# Patient Record
Sex: Female | Born: 1940 | Race: White | Hispanic: No | Marital: Married | Smoking: Never smoker
Health system: Southern US, Community
[De-identification: ages and names within clinical notes are randomized; demographics above are authoritative.]

## PROBLEM LIST (undated history)

## (undated) DIAGNOSIS — M1990 Unspecified osteoarthritis, unspecified site: Secondary | ICD-10-CM

## (undated) DIAGNOSIS — R300 Dysuria: Secondary | ICD-10-CM

## (undated) DIAGNOSIS — K5900 Constipation, unspecified: Secondary | ICD-10-CM

## (undated) DIAGNOSIS — Z87440 Personal history of urinary (tract) infections: Secondary | ICD-10-CM

## (undated) DIAGNOSIS — N952 Postmenopausal atrophic vaginitis: Secondary | ICD-10-CM

## (undated) DIAGNOSIS — K219 Gastro-esophageal reflux disease without esophagitis: Secondary | ICD-10-CM

## (undated) DIAGNOSIS — R109 Unspecified abdominal pain: Secondary | ICD-10-CM

## (undated) DIAGNOSIS — E785 Hyperlipidemia, unspecified: Secondary | ICD-10-CM

## (undated) HISTORY — DX: Postmenopausal atrophic vaginitis: N95.2

## (undated) HISTORY — DX: Gastro-esophageal reflux disease without esophagitis: K21.9

## (undated) HISTORY — PX: BREAST BIOPSY: SHX20

## (undated) HISTORY — PX: ABDOMINAL HYSTERECTOMY: SHX81

## (undated) HISTORY — DX: Unspecified abdominal pain: R10.9

## (undated) HISTORY — PX: BREAST EXCISIONAL BIOPSY: SUR124

## (undated) HISTORY — PX: MOHS SURGERY: SHX181

## (undated) HISTORY — DX: Hyperlipidemia, unspecified: E78.5

## (undated) HISTORY — PX: COLONOSCOPY: SHX174

## (undated) HISTORY — PX: BREAST SURGERY: SHX581

## (undated) HISTORY — DX: Dysuria: R30.0

---

## 2005-01-31 ENCOUNTER — Ambulatory Visit: Payer: Self-pay | Admitting: Family Medicine

## 2006-03-13 ENCOUNTER — Ambulatory Visit: Payer: Self-pay | Admitting: Family Medicine

## 2006-04-19 ENCOUNTER — Ambulatory Visit: Payer: Self-pay | Admitting: Gastroenterology

## 2007-05-14 ENCOUNTER — Ambulatory Visit: Payer: Self-pay | Admitting: Family Medicine

## 2008-05-20 ENCOUNTER — Ambulatory Visit: Payer: Self-pay | Admitting: Family Medicine

## 2009-06-10 ENCOUNTER — Ambulatory Visit: Payer: Self-pay | Admitting: Family Medicine

## 2010-07-27 ENCOUNTER — Ambulatory Visit: Payer: Self-pay | Admitting: Family Medicine

## 2011-08-30 ENCOUNTER — Ambulatory Visit: Payer: Self-pay | Admitting: Family Medicine

## 2012-10-08 ENCOUNTER — Ambulatory Visit: Payer: Self-pay | Admitting: Internal Medicine

## 2013-03-04 ENCOUNTER — Ambulatory Visit: Payer: Self-pay

## 2013-10-09 ENCOUNTER — Ambulatory Visit: Payer: Self-pay | Admitting: Internal Medicine

## 2014-02-20 DIAGNOSIS — M810 Age-related osteoporosis without current pathological fracture: Secondary | ICD-10-CM | POA: Insufficient documentation

## 2014-02-20 DIAGNOSIS — K219 Gastro-esophageal reflux disease without esophagitis: Secondary | ICD-10-CM | POA: Insufficient documentation

## 2014-02-20 DIAGNOSIS — E785 Hyperlipidemia, unspecified: Secondary | ICD-10-CM | POA: Insufficient documentation

## 2014-11-10 ENCOUNTER — Ambulatory Visit: Payer: Self-pay | Admitting: Internal Medicine

## 2014-11-10 DIAGNOSIS — Z1231 Encounter for screening mammogram for malignant neoplasm of breast: Secondary | ICD-10-CM | POA: Diagnosis not present

## 2014-11-22 DIAGNOSIS — H2513 Age-related nuclear cataract, bilateral: Secondary | ICD-10-CM | POA: Diagnosis not present

## 2014-12-04 DIAGNOSIS — R300 Dysuria: Secondary | ICD-10-CM | POA: Diagnosis not present

## 2014-12-04 DIAGNOSIS — N390 Urinary tract infection, site not specified: Secondary | ICD-10-CM | POA: Diagnosis not present

## 2015-01-14 DIAGNOSIS — N3090 Cystitis, unspecified without hematuria: Secondary | ICD-10-CM | POA: Diagnosis not present

## 2015-01-14 DIAGNOSIS — R350 Frequency of micturition: Secondary | ICD-10-CM | POA: Diagnosis not present

## 2015-01-26 DIAGNOSIS — H1010 Acute atopic conjunctivitis, unspecified eye: Secondary | ICD-10-CM | POA: Diagnosis not present

## 2015-01-26 DIAGNOSIS — J329 Chronic sinusitis, unspecified: Secondary | ICD-10-CM | POA: Diagnosis not present

## 2015-01-26 DIAGNOSIS — B9689 Other specified bacterial agents as the cause of diseases classified elsewhere: Secondary | ICD-10-CM | POA: Diagnosis not present

## 2015-03-01 DIAGNOSIS — E785 Hyperlipidemia, unspecified: Secondary | ICD-10-CM | POA: Diagnosis not present

## 2015-03-01 DIAGNOSIS — Z79899 Other long term (current) drug therapy: Secondary | ICD-10-CM | POA: Diagnosis not present

## 2015-03-07 DIAGNOSIS — M160 Bilateral primary osteoarthritis of hip: Secondary | ICD-10-CM | POA: Diagnosis not present

## 2015-03-07 DIAGNOSIS — Z0000 Encounter for general adult medical examination without abnormal findings: Secondary | ICD-10-CM | POA: Diagnosis not present

## 2015-03-07 DIAGNOSIS — M810 Age-related osteoporosis without current pathological fracture: Secondary | ICD-10-CM | POA: Diagnosis not present

## 2015-03-07 DIAGNOSIS — R198 Other specified symptoms and signs involving the digestive system and abdomen: Secondary | ICD-10-CM | POA: Diagnosis not present

## 2015-03-07 DIAGNOSIS — K219 Gastro-esophageal reflux disease without esophagitis: Secondary | ICD-10-CM | POA: Diagnosis not present

## 2015-03-07 DIAGNOSIS — N390 Urinary tract infection, site not specified: Secondary | ICD-10-CM | POA: Diagnosis not present

## 2015-03-07 DIAGNOSIS — Z23 Encounter for immunization: Secondary | ICD-10-CM | POA: Diagnosis not present

## 2015-03-07 DIAGNOSIS — E780 Pure hypercholesterolemia: Secondary | ICD-10-CM | POA: Diagnosis not present

## 2015-03-11 ENCOUNTER — Other Ambulatory Visit: Payer: Self-pay | Admitting: Nurse Practitioner

## 2015-03-11 DIAGNOSIS — L989 Disorder of the skin and subcutaneous tissue, unspecified: Secondary | ICD-10-CM | POA: Diagnosis not present

## 2015-03-11 DIAGNOSIS — R194 Change in bowel habit: Secondary | ICD-10-CM | POA: Diagnosis not present

## 2015-03-15 ENCOUNTER — Ambulatory Visit
Admission: RE | Admit: 2015-03-15 | Discharge: 2015-03-15 | Disposition: A | Discharge status: Home / Self Care | Payer: Commercial Managed Care - HMO | Source: Ambulatory Visit | Attending: Nurse Practitioner | Admitting: Nurse Practitioner

## 2015-03-15 DIAGNOSIS — L989 Disorder of the skin and subcutaneous tissue, unspecified: Secondary | ICD-10-CM | POA: Diagnosis not present

## 2015-03-15 DIAGNOSIS — R29898 Other symptoms and signs involving the musculoskeletal system: Secondary | ICD-10-CM | POA: Diagnosis not present

## 2015-03-23 ENCOUNTER — Encounter: Payer: Self-pay | Admitting: *Deleted

## 2015-03-24 ENCOUNTER — Ambulatory Visit
Admission: RE | Admit: 2015-03-24 | Discharge: 2015-03-24 | Disposition: A | Discharge status: Home / Self Care | Payer: Commercial Managed Care - HMO | Source: Ambulatory Visit | Attending: Gastroenterology | Admitting: Gastroenterology

## 2015-03-24 ENCOUNTER — Encounter
Admission: RE | Disposition: A | Discharge status: Home / Self Care | Payer: Self-pay | Source: Ambulatory Visit | Attending: Gastroenterology

## 2015-03-24 ENCOUNTER — Ambulatory Visit: Payer: Commercial Managed Care - HMO | Admitting: Anesthesiology

## 2015-03-24 DIAGNOSIS — Z79899 Other long term (current) drug therapy: Secondary | ICD-10-CM | POA: Diagnosis not present

## 2015-03-24 DIAGNOSIS — K635 Polyp of colon: Secondary | ICD-10-CM | POA: Diagnosis not present

## 2015-03-24 DIAGNOSIS — R197 Diarrhea, unspecified: Secondary | ICD-10-CM | POA: Diagnosis not present

## 2015-03-24 DIAGNOSIS — L989 Disorder of the skin and subcutaneous tissue, unspecified: Secondary | ICD-10-CM | POA: Diagnosis not present

## 2015-03-24 DIAGNOSIS — D123 Benign neoplasm of transverse colon: Secondary | ICD-10-CM | POA: Diagnosis not present

## 2015-03-24 DIAGNOSIS — Z7982 Long term (current) use of aspirin: Secondary | ICD-10-CM | POA: Diagnosis not present

## 2015-03-24 DIAGNOSIS — D122 Benign neoplasm of ascending colon: Secondary | ICD-10-CM | POA: Diagnosis not present

## 2015-03-24 DIAGNOSIS — R194 Change in bowel habit: Secondary | ICD-10-CM | POA: Diagnosis present

## 2015-03-24 HISTORY — PX: COLONOSCOPY WITH PROPOFOL: SHX5780

## 2015-03-24 HISTORY — DX: Personal history of urinary (tract) infections: Z87.440

## 2015-03-24 HISTORY — DX: Unspecified osteoarthritis, unspecified site: M19.90

## 2015-03-24 HISTORY — DX: Constipation, unspecified: K59.00

## 2015-03-24 SURGERY — COLONOSCOPY WITH PROPOFOL
Anesthesia: General

## 2015-03-24 MED ORDER — SODIUM CHLORIDE 0.9 % IV SOLN
INTRAVENOUS | Status: DC
  Administered 2015-03-24: 1000 mL via INTRAVENOUS

## 2015-03-24 MED ORDER — PROPOFOL 10 MG/ML IV BOLUS
INTRAVENOUS | Status: DC | PRN
  Administered 2015-03-24 (×3): 20 mg via INTRAVENOUS

## 2015-03-24 MED ORDER — MIDAZOLAM HCL 2 MG/2ML IJ SOLN
INTRAMUSCULAR | Status: DC | PRN
  Administered 2015-03-24: 1 mg via INTRAVENOUS

## 2015-03-24 NOTE — Anesthesia Preprocedure Evaluation (Signed)
Anesthesia Evaluation  Patient identified by MRN, date of birth, ID band Patient awake    Reviewed: Allergy & Precautions, H&P , NPO status , Patient's Chart, lab work & pertinent test results, reviewed documented beta blocker date and time   Airway Mallampati: II  TM Distance: >3 FB Neck ROM: full    Dental no notable dental hx. (+) Teeth Intact   Pulmonary neg pulmonary ROS,  breath sounds clear to auscultation  Pulmonary exam normal       Cardiovascular Exercise Tolerance: Good negative cardio ROS  Rhythm:regular Rate:Normal     Neuro/Psych negative neurological ROS  negative psych ROS   GI/Hepatic negative GI ROS, Neg liver ROS,   Endo/Other  negative endocrine ROS  Renal/GU negative Renal ROS  negative genitourinary   Musculoskeletal   Abdominal   Peds  Hematology negative hematology ROS (+)   Anesthesia Other Findings   Reproductive/Obstetrics negative OB ROS                             Anesthesia Physical Anesthesia Plan  ASA: II  Anesthesia Plan: General   Post-op Pain Management:    Induction:   Airway Management Planned:   Additional Equipment:   Intra-op Plan:   Post-operative Plan:   Informed Consent: I have reviewed the patients History and Physical, chart, labs and discussed the procedure including the risks, benefits and alternatives for the proposed anesthesia with the patient or authorized representative who has indicated his/her understanding and acceptance.   Dental Advisory Given  Plan Discussed with: CRNA  Anesthesia Plan Comments:         Anesthesia Quick Evaluation

## 2015-03-24 NOTE — Op Note (Signed)
[facility] Patient Name: Kelly Rivas Procedure Date: 03/24/2015 9:15 AM MRN: [id number] Account #: [id number] Date of Birth: 12-Nov-1940 Admit Type: Outpatient Age: 74 Room: [facility] ENDO ROOM 4 Gender: Female Note Status: Finalized Procedure:         Colonoscopy Indications:       Change in bowel habits Providers:         Lupita Dawn. Candace Cruise, MD Referring MD:      Ocie Cornfield. Ouida Sills, MD (Referring MD) Medicines:         Monitored Anesthesia Care Complications:     No immediate complications. Procedure:         Pre-Anesthesia Assessment:                    - Prior to the procedure, a History and Physical was                     performed, and patient medications, allergies and                     sensitivities were reviewed. The patient's tolerance of                     previous anesthesia was reviewed.                    - The risks and benefits of the procedure and the sedation                     options and risks were discussed with the patient. All                     questions were answered and informed consent was obtained.                    - After reviewing the risks and benefits, the patient was                     deemed in satisfactory condition to undergo the procedure.                    After obtaining informed consent, the colonoscope was                     passed under direct vision. Throughout the procedure, the                     patient's blood pressure, pulse, and oxygen saturations                     were monitored continuously. The Colonoscope was                     introduced through the anus and advanced to the the cecum,                     identified by appendiceal orifice and ileocecal valve. The                     colonoscopy was performed without difficulty. The patient                     tolerated the procedure well. The quality of the bowel  preparation was good. Findings:      A small polyp was  found at the hepatic flexure. The polyp was sessile.       The polyp was removed with a cold snare. Resection and retrieval were       complete. Biopsies for histology were taken with a cold forceps from the       left colon for evaluation of microscopic colitis.      The exam was otherwise without abnormality. Impression:        - One small polyp at the hepatic flexure. Resected and                     retrieved. Biopsied.                    - The examination was otherwise normal. Recommendation:    - Discharge patient to home.                    - Await pathology results.                    - Repeat colonoscopy in 5 years for surveillance based on                     pathology results.                    - The findings and recommendations were discussed with the                     patient. 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 All rights reserved. The codes documented in this report are preliminary and upon coder review may  be revised to meet current compliance requirements. Hulen Luster, MD 03/24/2015 9:46:32 AM This report has been signed electronically. Number of Addenda: 0 Note Initiated On: 03/24/2015 9:15 AM Scope Withdrawal Time: 0 hours 5 minutes 19 seconds  Total Procedure Duration: 0 hours 14 minutes 29 seconds       [facility]

## 2015-03-24 NOTE — H&P (Signed)
  Date of Initial H&P:03/11/2015  History reviewed, patient examined, no change in status, stable for surgery.

## 2015-03-24 NOTE — Transfer of Care (Signed)
Immediate Anesthesia Transfer of Care Note  Patient: Kelly Rivas  Procedure(s) Performed: Procedure(s): COLONOSCOPY WITH PROPOFOL (N/A)  Patient Location: PACU and Endoscopy Unit  Anesthesia Type:General  Level of Consciousness: sedated  Airway & Oxygen Therapy: Patient connected to nasal cannula oxygen  Post-op Assessment: Report given to RN  Post vital signs: stable  Last Vitals:  Filed Vitals:   03/24/15 0949  BP: 128/76  Pulse: 73  Temp: 35.8 C  Resp: 16    Complications: No apparent anesthesia complications

## 2015-03-24 NOTE — Anesthesia Postprocedure Evaluation (Signed)
  Anesthesia Post-op Note  Patient: Kelly Rivas  Procedure(s) Performed: Procedure(s): COLONOSCOPY WITH PROPOFOL (N/A)  Anesthesia type:General  Patient location: PACU  Post pain: Pain level controlled  Post assessment: Post-op Vital signs reviewed, Patient's Cardiovascular Status Stable, Respiratory Function Stable, Patent Airway and No signs of Nausea or vomiting  Post vital signs: Reviewed and stable  Last Vitals:  Filed Vitals:   03/24/15 0949  BP: 128/76  Pulse: 73  Temp: 35.8 C  Resp: 16    Level of consciousness: awake, alert  and patient cooperative  Complications: No apparent anesthesia complications

## 2015-03-25 ENCOUNTER — Encounter: Payer: Self-pay | Admitting: Gastroenterology

## 2015-03-25 LAB — SURGICAL PATHOLOGY

## 2015-04-06 ENCOUNTER — Other Ambulatory Visit: Payer: Self-pay | Admitting: Nurse Practitioner

## 2015-04-06 DIAGNOSIS — L989 Disorder of the skin and subcutaneous tissue, unspecified: Secondary | ICD-10-CM

## 2015-04-12 ENCOUNTER — Ambulatory Visit
Admission: RE | Admit: 2015-04-12 | Discharge: 2015-04-12 | Disposition: A | Discharge status: Home / Self Care | Payer: Commercial Managed Care - HMO | Source: Ambulatory Visit | Attending: Nurse Practitioner | Admitting: Nurse Practitioner

## 2015-04-12 DIAGNOSIS — M629 Disorder of muscle, unspecified: Secondary | ICD-10-CM | POA: Diagnosis not present

## 2015-04-12 DIAGNOSIS — L989 Disorder of the skin and subcutaneous tissue, unspecified: Secondary | ICD-10-CM | POA: Diagnosis not present

## 2015-04-12 MED ORDER — GADOBENATE DIMEGLUMINE 529 MG/ML IV SOLN
15.0000 mL | Freq: Once | INTRAVENOUS | Status: AC | PRN
  Administered 2015-04-12: 12 mL via INTRAVENOUS

## 2015-04-22 ENCOUNTER — Other Ambulatory Visit: Payer: Self-pay | Admitting: *Deleted

## 2015-04-22 ENCOUNTER — Encounter: Payer: Self-pay | Admitting: *Deleted

## 2015-05-03 ENCOUNTER — Encounter: Payer: Self-pay | Admitting: Urology

## 2015-05-03 ENCOUNTER — Ambulatory Visit (INDEPENDENT_AMBULATORY_CARE_PROVIDER_SITE_OTHER): Payer: Commercial Managed Care - HMO | Admitting: Urology

## 2015-05-03 VITALS — BP 107/74 | HR 76 | Ht 62.0 in | Wt 132.7 lb

## 2015-05-03 DIAGNOSIS — N390 Urinary tract infection, site not specified: Secondary | ICD-10-CM | POA: Diagnosis not present

## 2015-05-03 DIAGNOSIS — N952 Postmenopausal atrophic vaginitis: Secondary | ICD-10-CM | POA: Insufficient documentation

## 2015-05-03 NOTE — Progress Notes (Signed)
05/03/2015 9:10 AM   Kelly Rivas [birth year] [id number]  Referring provider: Kirk Ruths, MD [place], [place] [address]  Chief Complaint  Patient presents with  . Chronic uti    one year recheck  . Vaginitis    one year recheck    HPI: Patient is a 74 year old white female with a history of atrophic vaginitis and recurrent urinary tract infections who presents today for a 1 year recheck.  She is still using the compounded vaginal estrogen cream from [facility] 3 nights weekly.  He is not experiencing any irritation or rash from applying the cream.  She is not sexually active at this time.  She does report a urinary tract infection in 12/04/2014 for 2 species Escherichia coli and enterococcus.  Both organisms were pan sensitive A responded to the antibiotic she was given.  Her baseline urinary symptoms are nocturia 2, occasional leakage of urine, occasional urinary hesitancy and occasional weak urinary stream. These are not bothersome to her she denies any fever, chills, nausea or vomiting. She denies any recent gross hematuria, dysuria or suprapubic pain.     PMH: Past Medical History  Diagnosis Date  . Arthritis   . History of recurrent UTIs   . Constipation   . Dysuria   . HLD (hyperlipidemia)   . GERD (gastroesophageal reflux disease)   . Atrophic vaginitis   . Flank pain     Surgical History: Past Surgical History  Procedure Laterality Date  . Breast surgery    . Abdominal hysterectomy    . Colonoscopy N/A   . Colonoscopy with propofol N/A 03/24/2015    Procedure: COLONOSCOPY WITH PROPOFOL;  Surgeon: Hulen Luster, MD;  Location: [facility] ENDOSCOPY;  Service: Gastroenterology;  Laterality: N/A;  . Mohs surgery      Home Medications:    Medication List       This list is accurate as of: 05/03/15  9:10 AM.  Always use your most recent med list.               aspirin EC 81 MG tablet  Take by mouth.     azelastine 0.05 %  ophthalmic solution  Commonly known as:  OPTIVAR  Apply to eye.     Calcium Carbonate-Vitamin D 600-400 MG-UNIT per tablet  Take by mouth.     Calcium Citrate-Vitamin D 1000-400 Liqd     ESTRACE VAGINAL 0.1 MG/GM vaginal cream  Generic drug:  estradiol  Place vaginally.     EYE DROPS 0.012-0.2 % Soln  Generic drug:  Naphazoline-Polyethyl Glycol     fluticasone 50 MCG/ACT nasal spray  Commonly known as:  FLONASE  Place into the nose.     MULTI-VITAMINS Tabs  Take by mouth.     omeprazole 20 MG capsule  Commonly known as:  PRILOSEC  Take by mouth.     pravastatin 40 MG tablet  Commonly known as:  PRAVACHOL  TAKE 1 TABLET EVERY DAY     vitamin C 500 MG tablet  Commonly known as:  ASCORBIC ACID  Take by mouth.        Allergies:  Allergies  Allergen Reactions  . Nitrofurantoin     Family History: Family History  Problem Relation Age of Onset  . Diabetes Mellitus II Mother   . Breast cancer Sister   . Kidney disease Neg Hx   . Bladder Cancer Neg Hx     Social History:  reports that she  has never smoked. She has never used smokeless tobacco. She reports that she does not drink alcohol or use illicit drugs.  ROS: UROLOGY Frequent Urination?: No Hard to postpone urination?: No Burning/pain with urination?: No Get up at night to urinate?: Yes Leakage of urine?: Yes Urine stream starts and stops?: Yes Trouble starting stream?: No Do you have to strain to urinate?: No Blood in urine?: No Urinary tract infection?: No Sexually transmitted disease?: No Injury to kidneys or bladder?: No Painful intercourse?: No Weak stream?: Yes Currently pregnant?: No Vaginal bleeding?: No Last menstrual period?: n  Gastrointestinal Nausea?: No Vomiting?: No Indigestion/heartburn?: Yes Diarrhea?: Yes Constipation?: No  Constitutional Fever: No Night sweats?: Yes Weight loss?: No Fatigue?: No  Skin Skin rash/lesions?: No Itching?: No  Eyes Blurred vision?:  No Double vision?: No  Ears/Nose/Throat Sore throat?: No Sinus problems?: Yes  Hematologic/Lymphatic Swollen glands?: No Easy bruising?: Yes  Cardiovascular Leg swelling?: Yes Chest pain?: No  Respiratory Cough?: No Shortness of breath?: No  Endocrine Excessive thirst?: No  Musculoskeletal Back pain?: Yes Joint pain?: Yes  Neurological Headaches?: No Dizziness?: No  Psychologic Depression?: No Anxiety?: No   Physical Exam: BP 107/74 mmHg  Pulse 76  Ht 5\' 2"  (1.575 m)  Wt 132 lb 11.2 oz (60.192 kg)  BMI 24.26 kg/m2  GU: Atrophic external genitalia.  Normal urethral meatus. No urethral masses and/or tenderness. No bladder fullness or masses. No vaginal lesions or discharge. Normal rectal tone, no masses. Normal anus and perineum.     Assessment & Plan:    1. Atrophic vaginitis:  Patient is applying the vaginal estrogen cream (COMPOUNDED) three nights weekly.  She is not having irritation or rash from the cream.  She will continue the medication.  She does not need a refill at this time.  2. Recurrent UTI's:  Patient had 1 urinary tract infection since she was last seen by [place] 1 year ago. She would like to have [place] treat her UTI's.  Patient is advised that she can call our office if she feels symptoms of an UTI and have [place] look at an UA.    There are no diagnoses linked to this encounter.  No Follow-up on file.  Zara Council, [facility] [address] [place], [place] [address] [phone]

## 2015-05-18 DIAGNOSIS — X32XXXA Exposure to sunlight, initial encounter: Secondary | ICD-10-CM | POA: Diagnosis not present

## 2015-05-18 DIAGNOSIS — D2271 Melanocytic nevi of right lower limb, including hip: Secondary | ICD-10-CM | POA: Diagnosis not present

## 2015-05-18 DIAGNOSIS — Z85828 Personal history of other malignant neoplasm of skin: Secondary | ICD-10-CM | POA: Diagnosis not present

## 2015-05-18 DIAGNOSIS — D225 Melanocytic nevi of trunk: Secondary | ICD-10-CM | POA: Diagnosis not present

## 2015-05-18 DIAGNOSIS — L821 Other seborrheic keratosis: Secondary | ICD-10-CM | POA: Diagnosis not present

## 2015-05-18 DIAGNOSIS — L570 Actinic keratosis: Secondary | ICD-10-CM | POA: Diagnosis not present

## 2015-08-02 DIAGNOSIS — J069 Acute upper respiratory infection, unspecified: Secondary | ICD-10-CM | POA: Diagnosis not present

## 2015-11-24 DIAGNOSIS — H2513 Age-related nuclear cataract, bilateral: Secondary | ICD-10-CM | POA: Diagnosis not present

## 2016-02-29 DIAGNOSIS — E786 Lipoprotein deficiency: Secondary | ICD-10-CM | POA: Diagnosis not present

## 2016-03-07 ENCOUNTER — Other Ambulatory Visit: Payer: Self-pay | Admitting: Internal Medicine

## 2016-03-07 DIAGNOSIS — Z0000 Encounter for general adult medical examination without abnormal findings: Secondary | ICD-10-CM | POA: Diagnosis not present

## 2016-03-07 DIAGNOSIS — Z1231 Encounter for screening mammogram for malignant neoplasm of breast: Secondary | ICD-10-CM

## 2016-03-07 DIAGNOSIS — K219 Gastro-esophageal reflux disease without esophagitis: Secondary | ICD-10-CM | POA: Diagnosis not present

## 2016-03-07 DIAGNOSIS — M810 Age-related osteoporosis without current pathological fracture: Secondary | ICD-10-CM | POA: Diagnosis not present

## 2016-03-07 DIAGNOSIS — E7800 Pure hypercholesterolemia, unspecified: Secondary | ICD-10-CM | POA: Diagnosis not present

## 2016-03-23 ENCOUNTER — Ambulatory Visit
Admission: RE | Admit: 2016-03-23 | Discharge: 2016-03-23 | Disposition: A | Discharge status: Home / Self Care | Payer: Commercial Managed Care - HMO | Source: Ambulatory Visit | Attending: Internal Medicine | Admitting: Internal Medicine

## 2016-03-23 ENCOUNTER — Other Ambulatory Visit: Payer: Self-pay | Admitting: Internal Medicine

## 2016-03-23 DIAGNOSIS — Z1231 Encounter for screening mammogram for malignant neoplasm of breast: Secondary | ICD-10-CM | POA: Insufficient documentation

## 2016-04-16 DIAGNOSIS — R350 Frequency of micturition: Secondary | ICD-10-CM | POA: Diagnosis not present

## 2016-04-16 DIAGNOSIS — R102 Pelvic and perineal pain: Secondary | ICD-10-CM | POA: Diagnosis not present

## 2016-04-16 DIAGNOSIS — N390 Urinary tract infection, site not specified: Secondary | ICD-10-CM | POA: Diagnosis not present

## 2016-05-02 ENCOUNTER — Ambulatory Visit (INDEPENDENT_AMBULATORY_CARE_PROVIDER_SITE_OTHER): Payer: Commercial Managed Care - HMO | Admitting: Urology

## 2016-05-02 ENCOUNTER — Encounter: Payer: Self-pay | Admitting: Urology

## 2016-05-02 ENCOUNTER — Telehealth: Payer: Self-pay | Admitting: Urology

## 2016-05-02 VITALS — BP 114/78 | HR 79 | Ht 65.0 in | Wt 136.0 lb

## 2016-05-02 DIAGNOSIS — N952 Postmenopausal atrophic vaginitis: Secondary | ICD-10-CM | POA: Diagnosis not present

## 2016-05-02 DIAGNOSIS — N200 Calculus of kidney: Secondary | ICD-10-CM

## 2016-05-02 DIAGNOSIS — Z87440 Personal history of urinary (tract) infections: Secondary | ICD-10-CM

## 2016-05-02 NOTE — Telephone Encounter (Signed)
Please call [facility] and have them refill the compounded estrogen cream for [name].

## 2016-05-02 NOTE — Progress Notes (Signed)
05/02/2016 8:53 AM   Kelly Rivas [birth year] [id number]  Referring provider: Kirk Ruths, MD [place] [facility] [place], [place] [address]  Chief Complaint  Patient presents with  . Vaginitis    1 year follow up    HPI: Patient is a 75 year old Caucasian female with a history of atrophic vaginitis and recurrent urinary tract infections who presents today for a 1 year recheck.  Since she was last seen, she passed three urinary stones in March 2017.  She did not have gross hematuria.  She only had stomach cramps and loose BM's.  Once she passed the stones, the symptoms abated.  She was seen by her PCP in August 2017 for urinary frequency, loose BM's and low pelvic and back pain.  She increased her cranberry juice and started AZO and her symptoms improved.  Her UA on the 04/16/2016 was contaminated and her urine culture grew out mixed urogenital flora.  She was given an antibiotic, Cipro 250 mg for seven days and her symptoms completely abated.   She is still using the compounded vaginal estrogen cream from [facility] 3 nights weekly.  She is not experiencing any irritation or rash from applying the cream.  She is not sexually active at this time.  She does report a urinary tract infection in 12/04/2014 for 2 species Escherichia coli and enterococcus.  Both organisms were pan sensitive.  It responded to the antibiotic she was given.  Her baseline urinary symptoms are nocturia 2 intermittently, occasional urinary hesitancy and occasional weak urinary stream. These are not bothersome to her she denies any fever, chills, nausea or vomiting. She denies any recent gross hematuria, dysuria or suprapubic pain.  She is not having constipation and her urinary leakage has abated since her and her husband have started a walking problem.    PMH: Past Medical History:  Diagnosis Date  . Arthritis   . Atrophic vaginitis   . Constipation   . Dysuria   .  Flank pain   . GERD (gastroesophageal reflux disease)   . History of recurrent UTIs   . HLD (hyperlipidemia)     Surgical History: Past Surgical History:  Procedure Laterality Date  . ABDOMINAL HYSTERECTOMY    . BREAST BIOPSY Bilateral 1970's  . BREAST SURGERY    . COLONOSCOPY N/A   . COLONOSCOPY WITH PROPOFOL N/A 03/24/2015   Procedure: COLONOSCOPY WITH PROPOFOL;  Surgeon: Hulen Luster, MD;  Location: [facility] ENDOSCOPY;  Service: Gastroenterology;  Laterality: N/A;  . MOHS SURGERY      Home Medications:    Medication List       Accurate as of 05/02/16  8:53 AM. Always use your most recent med list.          aspirin EC 81 MG tablet Take by mouth.   Calcium Carbonate-Vitamin D 600-400 MG-UNIT tablet Take by mouth.   Calcium Citrate-Vitamin D 1000-400 Liqd   ESTRACE VAGINAL 0.1 MG/GM vaginal cream Generic drug:  estradiol Place vaginally.   EYE DROPS 0.012-0.2 % Soln Generic drug:  Naphazoline-Polyethyl Glycol   fluticasone 50 MCG/ACT nasal spray Commonly known as:  FLONASE Place into the nose.   MULTI-VITAMINS Tabs Take by mouth.   omeprazole 20 MG capsule Commonly known as:  PRILOSEC Take by mouth.   pravastatin 40 MG tablet Commonly known as:  PRAVACHOL TAKE 1 TABLET EVERY DAY   psyllium 0.52 g capsule Commonly known as:  REGULOID Take 0.52 g by  mouth 2 (two) times daily.   vitamin C 500 MG tablet Commonly known as:  ASCORBIC ACID Take by mouth.       Allergies:  Allergies  Allergen Reactions  . Nitrofurantoin     Family History: Family History  Problem Relation Age of Onset  . Diabetes Mellitus II Mother   . Breast cancer Sister   . Kidney disease Neg Hx   . Bladder Cancer Neg Hx     Social History:  reports that she has never smoked. She has never used smokeless tobacco. She reports that she does not drink alcohol or use drugs.  ROS: UROLOGY Frequent Urination?: No Hard to postpone urination?: No Burning/pain with urination?: No Get  up at night to urinate?: No Leakage of urine?: No Urine stream starts and stops?: No Trouble starting stream?: No Do you have to strain to urinate?: No Blood in urine?: No Urinary tract infection?: No Sexually transmitted disease?: No Injury to kidneys or bladder?: No Painful intercourse?: No Weak stream?: No Currently pregnant?: No Vaginal bleeding?: No Last menstrual period?: n  Gastrointestinal Nausea?: No Vomiting?: No Indigestion/heartburn?: No Diarrhea?: No Constipation?: No  Constitutional Fever: No Night sweats?: No Weight loss?: No Fatigue?: No  Skin Skin rash/lesions?: No Itching?: No  Eyes Blurred vision?: No Double vision?: No  Ears/Nose/Throat Sore throat?: No Sinus problems?: No  Hematologic/Lymphatic Swollen glands?: No Easy bruising?: No  Cardiovascular Leg swelling?: No Chest pain?: No  Respiratory Cough?: No Shortness of breath?: No  Endocrine Excessive thirst?: No  Musculoskeletal Back pain?: No Joint pain?: No  Neurological Headaches?: No Dizziness?: No  Psychologic Depression?: No Anxiety?: No   Physical Exam: BP 114/78   Pulse 79   Ht 5\' 5"  (1.651 m)   Wt 136 lb (61.7 kg)   BMI 22.63 kg/m   Constitutional: Well nourished. Alert and oriented, No acute distress. HEENT: [place] AT, moist mucus membranes. Trachea midline, no masses. Cardiovascular: No clubbing, cyanosis, or edema. Respiratory: Normal respiratory effort, no increased work of breathing. GI: Abdomen is soft, non tender, non distended, no abdominal masses. Liver and spleen not palpable.  No hernias appreciated.  Stool sample for occult testing is not indicated.   GU: No CVA tenderness.  No bladder fullness or masses.  Normal external genitalia, normal pubic hair distribution, no lesions.  Normal urethral meatus, no lesions, no prolapse, no discharge.   No urethral masses, tenderness and/or tenderness. No bladder fullness, tenderness or masses. Normal vagina  mucosa, good estrogen effect, no discharge, no lesions, good pelvic support, Grade II cystocele, no rectocele noted.  Cervix is surgically absent.  Uterus is surgically absent.    No adnexal/parametria masses or tenderness noted.  Anus and perineum are without rashes or lesions.    Skin: No rashes, bruises or suspicious lesions. Lymph: No cervical or inguinal adenopathy. Neurologic: Grossly intact, no focal deficits, moving all 4 extremities. Psychiatric: Normal mood and affect.   Assessment & Plan:    1. Atrophic vaginitis:  Patient is applying the vaginal estrogen cream (COMPOUNDED) three nights weekly.  She is not having irritation or rash from the cream.  She will continue the medication.  Sample of Estrace is given.  Refill is given.    2. History of recurrent UTI's:  Patient had symptoms of an urinary tract infection since she was last seen by [place] 1 year ago.   Urine culture grew out mixed urogenital flora.  She would like to have [place] treat her UTI's.  Patient is advised  that she can call our office if she feels symptoms of an UTI and have [place] look at an UA.    3. Nephrolithiasis:   Patient spontaneously passed three small stones.    Return in about 1 year (around 05/02/2017) for exam and symptom recheck.  Zara Council, [facility] [address], [place], [place] [address] [phone]

## 2016-05-02 NOTE — Telephone Encounter (Signed)
Refills called into [facility].

## 2016-05-16 DIAGNOSIS — X32XXXA Exposure to sunlight, initial encounter: Secondary | ICD-10-CM | POA: Diagnosis not present

## 2016-05-16 DIAGNOSIS — Z85828 Personal history of other malignant neoplasm of skin: Secondary | ICD-10-CM | POA: Diagnosis not present

## 2016-05-16 DIAGNOSIS — D225 Melanocytic nevi of trunk: Secondary | ICD-10-CM | POA: Diagnosis not present

## 2016-05-16 DIAGNOSIS — L821 Other seborrheic keratosis: Secondary | ICD-10-CM | POA: Diagnosis not present

## 2016-05-16 DIAGNOSIS — L308 Other specified dermatitis: Secondary | ICD-10-CM | POA: Diagnosis not present

## 2016-05-16 DIAGNOSIS — L570 Actinic keratosis: Secondary | ICD-10-CM | POA: Diagnosis not present

## 2016-07-31 DIAGNOSIS — M19072 Primary osteoarthritis, left ankle and foot: Secondary | ICD-10-CM | POA: Diagnosis not present

## 2016-07-31 DIAGNOSIS — M79672 Pain in left foot: Secondary | ICD-10-CM | POA: Diagnosis not present

## 2016-09-06 DIAGNOSIS — M25512 Pain in left shoulder: Secondary | ICD-10-CM | POA: Diagnosis not present

## 2016-09-06 DIAGNOSIS — M79672 Pain in left foot: Secondary | ICD-10-CM | POA: Diagnosis not present

## 2016-09-06 DIAGNOSIS — R030 Elevated blood-pressure reading, without diagnosis of hypertension: Secondary | ICD-10-CM | POA: Diagnosis not present

## 2016-09-18 DIAGNOSIS — M7752 Other enthesopathy of left foot: Secondary | ICD-10-CM | POA: Diagnosis not present

## 2016-10-08 DIAGNOSIS — N390 Urinary tract infection, site not specified: Secondary | ICD-10-CM | POA: Diagnosis not present

## 2016-10-08 DIAGNOSIS — R350 Frequency of micturition: Secondary | ICD-10-CM | POA: Diagnosis not present

## 2016-10-16 DIAGNOSIS — M7750 Other enthesopathy of unspecified foot: Secondary | ICD-10-CM | POA: Diagnosis not present

## 2016-11-26 DIAGNOSIS — H2513 Age-related nuclear cataract, bilateral: Secondary | ICD-10-CM | POA: Diagnosis not present

## 2016-12-10 DIAGNOSIS — H1852 Epithelial (juvenile) corneal dystrophy: Secondary | ICD-10-CM | POA: Diagnosis not present

## 2016-12-10 DIAGNOSIS — H2513 Age-related nuclear cataract, bilateral: Secondary | ICD-10-CM | POA: Diagnosis not present

## 2016-12-10 DIAGNOSIS — H353132 Nonexudative age-related macular degeneration, bilateral, intermediate dry stage: Secondary | ICD-10-CM | POA: Diagnosis not present

## 2016-12-10 DIAGNOSIS — H25013 Cortical age-related cataract, bilateral: Secondary | ICD-10-CM | POA: Diagnosis not present

## 2016-12-10 DIAGNOSIS — H2511 Age-related nuclear cataract, right eye: Secondary | ICD-10-CM | POA: Diagnosis not present

## 2016-12-10 DIAGNOSIS — H25011 Cortical age-related cataract, right eye: Secondary | ICD-10-CM | POA: Diagnosis not present

## 2016-12-10 DIAGNOSIS — H02833 Dermatochalasis of right eye, unspecified eyelid: Secondary | ICD-10-CM | POA: Diagnosis not present

## 2016-12-18 DIAGNOSIS — H25811 Combined forms of age-related cataract, right eye: Secondary | ICD-10-CM | POA: Diagnosis not present

## 2016-12-18 DIAGNOSIS — H2511 Age-related nuclear cataract, right eye: Secondary | ICD-10-CM | POA: Diagnosis not present

## 2017-01-01 DIAGNOSIS — H6123 Impacted cerumen, bilateral: Secondary | ICD-10-CM | POA: Diagnosis not present

## 2017-01-01 DIAGNOSIS — R51 Headache: Secondary | ICD-10-CM | POA: Diagnosis not present

## 2017-01-01 DIAGNOSIS — M47812 Spondylosis without myelopathy or radiculopathy, cervical region: Secondary | ICD-10-CM | POA: Diagnosis not present

## 2017-01-07 DIAGNOSIS — H25012 Cortical age-related cataract, left eye: Secondary | ICD-10-CM | POA: Diagnosis not present

## 2017-01-07 DIAGNOSIS — H2512 Age-related nuclear cataract, left eye: Secondary | ICD-10-CM | POA: Diagnosis not present

## 2017-01-15 DIAGNOSIS — H25812 Combined forms of age-related cataract, left eye: Secondary | ICD-10-CM | POA: Diagnosis not present

## 2017-01-15 DIAGNOSIS — H2512 Age-related nuclear cataract, left eye: Secondary | ICD-10-CM | POA: Diagnosis not present

## 2017-03-01 DIAGNOSIS — K219 Gastro-esophageal reflux disease without esophagitis: Secondary | ICD-10-CM | POA: Diagnosis not present

## 2017-03-01 DIAGNOSIS — E7800 Pure hypercholesterolemia, unspecified: Secondary | ICD-10-CM | POA: Diagnosis not present

## 2017-03-01 DIAGNOSIS — M810 Age-related osteoporosis without current pathological fracture: Secondary | ICD-10-CM | POA: Diagnosis not present

## 2017-03-01 DIAGNOSIS — Z0000 Encounter for general adult medical examination without abnormal findings: Secondary | ICD-10-CM | POA: Diagnosis not present

## 2017-03-08 ENCOUNTER — Other Ambulatory Visit: Payer: Self-pay | Admitting: Internal Medicine

## 2017-03-08 DIAGNOSIS — E7800 Pure hypercholesterolemia, unspecified: Secondary | ICD-10-CM | POA: Diagnosis not present

## 2017-03-08 DIAGNOSIS — M810 Age-related osteoporosis without current pathological fracture: Secondary | ICD-10-CM | POA: Diagnosis not present

## 2017-03-08 DIAGNOSIS — Z1231 Encounter for screening mammogram for malignant neoplasm of breast: Secondary | ICD-10-CM

## 2017-03-08 DIAGNOSIS — R030 Elevated blood-pressure reading, without diagnosis of hypertension: Secondary | ICD-10-CM | POA: Insufficient documentation

## 2017-03-08 DIAGNOSIS — Z0000 Encounter for general adult medical examination without abnormal findings: Secondary | ICD-10-CM | POA: Diagnosis not present

## 2017-03-15 DIAGNOSIS — M8588 Other specified disorders of bone density and structure, other site: Secondary | ICD-10-CM | POA: Diagnosis not present

## 2017-03-25 ENCOUNTER — Ambulatory Visit
Admission: RE | Admit: 2017-03-25 | Discharge: 2017-03-25 | Disposition: A | Discharge status: Home / Self Care | Payer: Commercial Managed Care - HMO | Source: Ambulatory Visit | Attending: Internal Medicine | Admitting: Internal Medicine

## 2017-03-25 DIAGNOSIS — Z1231 Encounter for screening mammogram for malignant neoplasm of breast: Secondary | ICD-10-CM | POA: Diagnosis not present

## 2017-05-06 NOTE — Progress Notes (Signed)
05/07/2017 9:06 AM   Kelly Rivas 10/17/1940 [id number]  Referring provider: Kirk Ruths, MD [facility] [place], [place] [address]  Chief Complaint  Patient presents with  . Recurrent UTI    1 year follow up  . Vaginitis    HPI: 76 yo WF with atrophic vaginitis, history of nephrolithiasis and a history of recurrent UTI's who presents today for a one year recheck.    Background history Patient is a 75 year old Caucasian female with a history of atrophic vaginitis and recurrent urinary tract infections who presents today for a 1 year recheck.  Since she was last seen, she passed three urinary stones in March 2017.  She did not have gross hematuria.  She only had stomach cramps and loose BM's.  Once she passed the stones, the symptoms abated.  She was seen by her PCP in August 2017 for urinary frequency, loose BM's and low pelvic and back pain.  She increased her cranberry juice and started AZO and her symptoms improved.  Her UA on the 04/16/2016 was contaminated and her urine culture grew out mixed urogenital flora.  She was given an antibiotic, Cipro 250 mg for seven days and her symptoms completely abated.  She is still using the compounded vaginal estrogen cream from [facility] 3 nights weekly.  She is not experiencing any irritation or rash from applying the cream.  She is not sexually active at this time.  She does report a urinary tract infection in 12/04/2014 for 2 species Escherichia coli and enterococcus.  Both organisms were pan sensitive.  It responded to the antibiotic she was given.  Her baseline urinary symptoms are nocturia 2 intermittently, occasional urinary hesitancy and occasional weak urinary stream. These are not bothersome to her she denies any fever, chills, nausea or vomiting. She denies any recent gross hematuria, dysuria or suprapubic pain.  She is not having constipation and her urinary leakage has abated since her and  her husband have started a walking program.    Atrophic vaginitis She is using the vaginal estrogen cream three nights weekly.    History of nephrolithiasis Of note, she has had an incidence of AMH with negative urine culture in 09/2016.  She has an occasional lower left back pain.    History of recurrent UTI's Today, she is experiencing urgency x 0-3, frequency x 4-7, not restricting fluids to avoid visits to the restroom, not engaging in toilet mapping, incontinence x 4-7 and nocturia x 0-3.  She is having right lower inguinal pain after urination.  She states the symptoms have been present for the last week.  8/10 pain.  Lasting a few minutes.  AZO helps the pain.  It starts with loose bowels.  She states that this has been cyclical over the last several months.   She has had two negative cultures since 2017 with UTI symptoms with her PCP.  Her UA today is positive for 11-30 WBC's, moderate bacteria and nitrite positive.  This is a clean catch specimen.    PMH: Past Medical History:  Diagnosis Date  . Arthritis   . Atrophic vaginitis   . Constipation   . Dysuria   . Flank pain   . GERD (gastroesophageal reflux disease)   . History of recurrent UTIs   . HLD (hyperlipidemia)     Surgical History: Past Surgical History:  Procedure Laterality Date  . ABDOMINAL HYSTERECTOMY    . BREAST BIOPSY Bilateral 1970's  .  BREAST SURGERY    . COLONOSCOPY N/A   . COLONOSCOPY WITH PROPOFOL N/A 03/24/2015   Procedure: COLONOSCOPY WITH PROPOFOL;  Surgeon: Hulen Luster, MD;  Location: [facility] ENDOSCOPY;  Service: Gastroenterology;  Laterality: N/A;  . MOHS SURGERY      Home Medications:  Allergies as of 05/07/2017      Reactions   Nitrofurantoin       Medication List       Accurate as of 05/07/17  9:06 AM. Always use your most recent med list.          aspirin EC 81 MG tablet Take by mouth.   Calcium Carbonate-Vitamin D 600-400 MG-UNIT tablet Take by mouth.   Calcium Citrate-Vitamin D  1000-400 Liqd   ESTRACE VAGINAL 0.1 MG/GM vaginal cream Generic drug:  estradiol Place vaginally.   EYE DROPS 0.012-0.2 % Soln Generic drug:  Naphazoline-Polyethyl Glycol   fluticasone 50 MCG/ACT nasal spray Commonly known as:  FLONASE Place into the nose.   MULTI-VITAMINS Tabs Take by mouth.   omeprazole 20 MG capsule Commonly known as:  PRILOSEC Take by mouth.   pravastatin 40 MG tablet Commonly known as:  PRAVACHOL TAKE 1 TABLET EVERY DAY   PRESERVISION AREDS PO Take by mouth.   psyllium 0.52 g capsule Commonly known as:  REGULOID Take 0.52 g by mouth 2 (two) times daily.   vitamin C 500 MG tablet Commonly known as:  ASCORBIC ACID Take by mouth.            Discharge Care Instructions        Start     Ordered   05/07/17 0000  Urinalysis, Complete     05/07/17 0845   05/07/17 0000  CT HEMATURIA WORKUP    Question Answer Comment  Reason for Exam (SYMPTOM  OR DIAGNOSIS REQUIRED) microscopic hematuria   Preferred imaging location? [facility]      05/07/17 0901   05/07/17 0000  BUN+Creat     05/07/17 0902      Allergies:  Allergies  Allergen Reactions  . Nitrofurantoin     Family History: Family History  Problem Relation Age of Onset  . Diabetes Mellitus II Mother   . Breast cancer Sister   . Kidney disease Neg Hx   . Bladder Cancer Neg Hx   . Kidney cancer Neg Hx     Social History:  reports that she has never smoked. She has never used smokeless tobacco. She reports that she does not drink alcohol or use drugs.  ROS: UROLOGY Frequent Urination?: Yes Hard to postpone urination?: Yes Burning/pain with urination?: Yes Get up at night to urinate?: Yes Leakage of urine?: Yes Urine stream starts and stops?: No Trouble starting stream?: No Do you have to strain to urinate?: No Blood in urine?: No Urinary tract infection?: Yes Sexually transmitted disease?: No Injury to kidneys or bladder?: No Painful intercourse?: No Weak  stream?: No Currently pregnant?: No Vaginal bleeding?: No Last menstrual period?: n  Gastrointestinal Nausea?: No Vomiting?: No Indigestion/heartburn?: No Diarrhea?: No Constipation?: No  Constitutional Fever: No Night sweats?: No Weight loss?: No Fatigue?: No  Skin Skin rash/lesions?: No Itching?: No  Eyes Blurred vision?: No Double vision?: No  Ears/Nose/Throat Sore throat?: No Sinus problems?: No  Hematologic/Lymphatic Swollen glands?: No Easy bruising?: No  Cardiovascular Leg swelling?: No Chest pain?: No  Respiratory Cough?: No Shortness of breath?: No  Endocrine Excessive thirst?: No  Musculoskeletal Back pain?: Yes Joint pain?: No  Neurological Headaches?: No Dizziness?:  No  Psychologic Depression?: No Anxiety?: No   Physical Exam: BP (!) 156/82   Pulse 82   Ht 5\' 2"  (1.575 m)   Wt 134 lb 4.8 oz (60.9 kg)   BMI 24.56 kg/m   Constitutional: Well nourished. Alert and oriented, No acute distress. HEENT: [place] AT, moist mucus membranes. Trachea midline, no masses. Cardiovascular: No clubbing, cyanosis, or edema. Respiratory: Normal respiratory effort, no increased work of breathing. GI: Abdomen is soft, non tender, non distended, no abdominal masses. Liver and spleen not palpable.  No hernias appreciated.  Stool sample for occult testing is not indicated.   GU: No CVA tenderness.  No bladder fullness or masses.  Normal external genitalia, normal pubic hair distribution, no lesions.  Normal urethral meatus, no lesions, no prolapse, no discharge.   No urethral masses, tenderness and/or tenderness. No bladder fullness, tenderness or masses. Normal vagina mucosa, good estrogen effect, no discharge, no lesions, good pelvic support, Grade II cystocele, no rectocele noted.  Cervix is surgically absent.  Uterus is surgically absent.    No adnexal/parametria masses or tenderness noted.  Anus and perineum are without rashes or lesions.    Skin: No rashes,  bruises or suspicious lesions. Lymph: No cervical or inguinal adenopathy. Neurologic: Grossly intact, no focal deficits, moving all 4 extremities. Psychiatric: Normal mood and affect.   Assessment & Plan:    1. Atrophic vaginitis:  Patient is applying the vaginal estrogen cream (COMPOUNDED) three nights weekly.  She is not having irritation or rash from the cream.  She will continue the medication.  Refill is not needed at this time.    2. History of recurrent UTI's  - patient with intermittent symptoms of an UTI with negative cultures  - UA is suspicious for infection - we'll hold off prescribe antibiotic at this time until culture results are available  - see history of hematuria  3. Nephrolithiasis:   Patient spontaneously passed three small stones a few months ago.    4. History of hematuria  - episode of AMH with negative culture in 09/2016  - patient having intermittent symptoms of suprapubic burning  - I explained to the patient that there are a number of causes that can be associated with blood in the urine, such as stones, UTI's, damage to the urinary tract and/or cancer.  - At this time, I felt that the patient warranted further urologic evaluation.   The AUA guidelines state that a CT urogram is the preferred imaging study to evaluate hematuria.  - I explained to the patient that a contrast material will be injected into a vein and that in rare instances, an allergic reaction can result and may even life threatening   The patient denies any allergies to contrast, iodine and/or seafood and is not taking metformin.  - Her reproductive status is hysterectomy  - Following the imaging study,  I've recommended a cystoscopy. I described how this is performed, typically in an office setting with a flexible cystoscope. We described the risks, benefits, and possible side effects, the most common of which is a minor amount of blood in the urine and/or burning which usually resolves in 24 to  48 hours.    - The patient had the opportunity to ask questions which were answered. Based upon this discussion, the patient is willing to proceed. Therefore, I've ordered: a CT Urogram and cystoscopy.  - The patient will return following all of the above for discussion of the results.   - UA  -  Urine culture  - BUN + creatinine    Return for CT Urogram report and cystoscopy.  Zara Council, [facility] [address], [place],  [address] [phone]

## 2017-05-07 ENCOUNTER — Encounter: Payer: Self-pay | Admitting: Urology

## 2017-05-07 ENCOUNTER — Ambulatory Visit (INDEPENDENT_AMBULATORY_CARE_PROVIDER_SITE_OTHER): Payer: Medicare HMO | Admitting: Urology

## 2017-05-07 VITALS — BP 156/82 | HR 82 | Ht 62.0 in | Wt 134.3 lb

## 2017-05-07 DIAGNOSIS — N390 Urinary tract infection, site not specified: Secondary | ICD-10-CM | POA: Diagnosis not present

## 2017-05-07 DIAGNOSIS — N952 Postmenopausal atrophic vaginitis: Secondary | ICD-10-CM

## 2017-05-07 DIAGNOSIS — Z87448 Personal history of other diseases of urinary system: Secondary | ICD-10-CM | POA: Diagnosis not present

## 2017-05-07 DIAGNOSIS — Z87440 Personal history of urinary (tract) infections: Secondary | ICD-10-CM | POA: Diagnosis not present

## 2017-05-07 DIAGNOSIS — N200 Calculus of kidney: Secondary | ICD-10-CM | POA: Diagnosis not present

## 2017-05-07 LAB — MICROSCOPIC EXAMINATION: RBC MICROSCOPIC, UA: NONE SEEN /HPF (ref 0–?)

## 2017-05-07 LAB — URINALYSIS, COMPLETE
Bilirubin, UA: NEGATIVE
Glucose, UA: NEGATIVE
KETONES UA: NEGATIVE
NITRITE UA: POSITIVE — AB
Protein, UA: NEGATIVE
RBC UA: NEGATIVE
Urobilinogen, Ur: 0.2 mg/dL (ref 0.2–1.0)
pH, UA: 6 (ref 5.0–7.5)

## 2017-05-08 LAB — BUN+CREAT
BUN / CREAT RATIO: 20 (ref 12–28)
BUN: 15 mg/dL (ref 8–27)
Creatinine, Ser: 0.74 mg/dL (ref 0.57–1.00)
GFR calc Af Amer: 93 mL/min/{1.73_m2} (ref >59)
GFR, EST NON AFRICAN AMERICAN: 81 mL/min/{1.73_m2} (ref >59)

## 2017-05-10 LAB — CULTURE, URINE COMPREHENSIVE

## 2017-05-13 ENCOUNTER — Telehealth: Payer: Self-pay

## 2017-05-13 ENCOUNTER — Other Ambulatory Visit: Payer: Self-pay | Admitting: Physician Assistant

## 2017-05-13 DIAGNOSIS — R1013 Epigastric pain: Secondary | ICD-10-CM

## 2017-05-13 DIAGNOSIS — R1011 Right upper quadrant pain: Secondary | ICD-10-CM | POA: Diagnosis not present

## 2017-05-13 NOTE — Telephone Encounter (Signed)
Pt called stating she is having pain up under her breast and a sore spot in between her breast. Pt denied skin break down, rash, or actual breast themselves hurting. Reinforced with pt BUA is a urology speciality and would need to call PCP. Pt voiced understanding.

## 2017-05-14 ENCOUNTER — Ambulatory Visit
Admission: RE | Admit: 2017-05-14 | Discharge: 2017-05-14 | Disposition: A | Discharge status: Home / Self Care | Payer: Medicare HMO | Source: Ambulatory Visit | Attending: Physician Assistant | Admitting: Physician Assistant

## 2017-05-14 DIAGNOSIS — R1013 Epigastric pain: Secondary | ICD-10-CM | POA: Diagnosis not present

## 2017-05-15 DIAGNOSIS — X32XXXA Exposure to sunlight, initial encounter: Secondary | ICD-10-CM | POA: Diagnosis not present

## 2017-05-15 DIAGNOSIS — D2261 Melanocytic nevi of right upper limb, including shoulder: Secondary | ICD-10-CM | POA: Diagnosis not present

## 2017-05-15 DIAGNOSIS — D2272 Melanocytic nevi of left lower limb, including hip: Secondary | ICD-10-CM | POA: Diagnosis not present

## 2017-05-15 DIAGNOSIS — L821 Other seborrheic keratosis: Secondary | ICD-10-CM | POA: Diagnosis not present

## 2017-05-15 DIAGNOSIS — Z85828 Personal history of other malignant neoplasm of skin: Secondary | ICD-10-CM | POA: Diagnosis not present

## 2017-05-15 DIAGNOSIS — L570 Actinic keratosis: Secondary | ICD-10-CM | POA: Diagnosis not present

## 2017-05-21 ENCOUNTER — Ambulatory Visit
Admission: RE | Admit: 2017-05-21 | Discharge: 2017-05-21 | Disposition: A | Discharge status: Home / Self Care | Payer: Medicare HMO | Source: Ambulatory Visit | Attending: Urology | Admitting: Urology

## 2017-05-21 DIAGNOSIS — I700 Atherosclerosis of aorta: Secondary | ICD-10-CM | POA: Diagnosis not present

## 2017-05-21 DIAGNOSIS — Z87448 Personal history of other diseases of urinary system: Secondary | ICD-10-CM | POA: Diagnosis not present

## 2017-05-21 DIAGNOSIS — K449 Diaphragmatic hernia without obstruction or gangrene: Secondary | ICD-10-CM | POA: Diagnosis not present

## 2017-05-21 MED ORDER — IOPAMIDOL (ISOVUE-300) INJECTION 61%: 150.0000 mL | Freq: Once | INTRAVENOUS | Status: DC | PRN

## 2017-05-21 MED ORDER — IOPAMIDOL (ISOVUE-300) INJECTION 61%
125.0000 mL | Freq: Once | INTRAVENOUS | Status: AC | PRN
  Administered 2017-05-21: 125 mL via INTRAVENOUS

## 2017-05-29 ENCOUNTER — Encounter: Payer: Self-pay | Admitting: Urology

## 2017-05-29 ENCOUNTER — Ambulatory Visit: Payer: Medicare HMO | Admitting: Urology

## 2017-05-29 VITALS — BP 145/85 | HR 77 | Ht 62.0 in | Wt 135.2 lb

## 2017-05-29 DIAGNOSIS — R3129 Other microscopic hematuria: Secondary | ICD-10-CM

## 2017-05-29 DIAGNOSIS — Z87440 Personal history of urinary (tract) infections: Secondary | ICD-10-CM | POA: Diagnosis not present

## 2017-05-29 DIAGNOSIS — Z87448 Personal history of other diseases of urinary system: Secondary | ICD-10-CM | POA: Diagnosis not present

## 2017-05-29 DIAGNOSIS — N952 Postmenopausal atrophic vaginitis: Secondary | ICD-10-CM

## 2017-05-29 LAB — URINALYSIS, COMPLETE
Bilirubin, UA: NEGATIVE
GLUCOSE, UA: NEGATIVE
Ketones, UA: NEGATIVE
Nitrite, UA: NEGATIVE
PROTEIN UA: NEGATIVE
RBC, UA: NEGATIVE
Specific Gravity, UA: <1.005 — ABNORMAL LOW (ref 1.005–1.030)
UUROB: 0.2 mg/dL (ref 0.2–1.0)
pH, UA: 6 (ref 5.0–7.5)

## 2017-05-29 LAB — MICROSCOPIC EXAMINATION: RBC, UA: NONE SEEN /hpf (ref 0–?)

## 2017-05-29 MED ORDER — CIPROFLOXACIN HCL 500 MG PO TABS
500.0000 mg | ORAL_TABLET | Freq: Once | ORAL | Status: AC
  Administered 2017-05-29: 500 mg via ORAL

## 2017-05-29 MED ORDER — LIDOCAINE HCL 2 % EX GEL
1.0000 "application " | Freq: Once | CUTANEOUS | Status: AC
  Administered 2017-05-29: 1 via URETHRAL

## 2017-05-29 NOTE — Progress Notes (Signed)
   05/29/17  CC:  Chief Complaint  Patient presents with  . Cysto    HPI: The patient is a 76 year old female with a past medical history of atrophic vaginitis on vaginal estrogen cream 3 times per week who presents today for completion of her microscopic hematuria workup. Her CT urogram was negative for source of microscopic hematuria. She also has a history of nephrolithiasis that she passed spontaneously x3, but her CT was negative for stone disease.  Blood pressure (!) 145/85, pulse 77, height 5\' 2"  (1.575 m), weight 135 lb 3.2 oz (61.3 kg). NED. A&Ox3.   No respiratory distress   Abd soft, NT, ND Normal external genitalia with patent urethral meatus  Cystoscopy Procedure Note  Patient identification was confirmed, informed consent was obtained, and patient was prepped using Betadine solution.  Lidocaine jelly was administered per urethral meatus.    Preoperative abx where received prior to procedure.    Procedure: - Flexible cystoscope introduced, without any difficulty.   - Thorough search of the bladder revealed:    normal urethral meatus    normal urothelium    no stones    no ulcers     no tumors    no urethral polyps    no trabeculation  - Ureteral orifices were normal in position and appearance.  Post-Procedure: - Patient tolerated the procedure well  Assessment/ Plan:  1. Microscopic hematuria -negative workup. Follow-up in one year with repeat urinalysis.  2. Atrophic vaginitis Continue vaginal estrogen cream  3. History of recurrent urinary tract infections Patient will call the office developed symptoms of a urinary tract infection for culture  Nickie Retort, MD

## 2017-06-12 ENCOUNTER — Encounter: Payer: Self-pay | Admitting: Urology

## 2017-08-12 DIAGNOSIS — Z961 Presence of intraocular lens: Secondary | ICD-10-CM | POA: Diagnosis not present

## 2017-08-12 DIAGNOSIS — H353131 Nonexudative age-related macular degeneration, bilateral, early dry stage: Secondary | ICD-10-CM | POA: Diagnosis not present

## 2017-08-12 DIAGNOSIS — H26493 Other secondary cataract, bilateral: Secondary | ICD-10-CM | POA: Diagnosis not present

## 2017-09-19 ENCOUNTER — Telehealth: Payer: Self-pay | Admitting: Urology

## 2017-09-19 NOTE — Telephone Encounter (Signed)
Pt had sample of Estrace cream from [facility] and would like a 90 day supply sent to [facility] on [address].  Please give pt a call [phone]

## 2017-09-20 MED ORDER — ESTRADIOL 0.1 MG/GM VA CREA: 1.0000 | TOPICAL_CREAM | Freq: Every day | VAGINAL | 12 refills | Status: DC

## 2017-09-20 NOTE — Telephone Encounter (Signed)
Script sent to [facility]

## 2017-12-23 DIAGNOSIS — N390 Urinary tract infection, site not specified: Secondary | ICD-10-CM | POA: Diagnosis not present

## 2018-01-29 DIAGNOSIS — M542 Cervicalgia: Secondary | ICD-10-CM | POA: Diagnosis not present

## 2018-02-06 DIAGNOSIS — M542 Cervicalgia: Secondary | ICD-10-CM | POA: Diagnosis not present

## 2018-02-17 DIAGNOSIS — R1031 Right lower quadrant pain: Secondary | ICD-10-CM | POA: Diagnosis not present

## 2018-02-17 DIAGNOSIS — N390 Urinary tract infection, site not specified: Secondary | ICD-10-CM | POA: Diagnosis not present

## 2018-02-17 DIAGNOSIS — R1032 Left lower quadrant pain: Secondary | ICD-10-CM | POA: Diagnosis not present

## 2018-03-06 DIAGNOSIS — Z0000 Encounter for general adult medical examination without abnormal findings: Secondary | ICD-10-CM | POA: Diagnosis not present

## 2018-03-06 DIAGNOSIS — E7800 Pure hypercholesterolemia, unspecified: Secondary | ICD-10-CM | POA: Diagnosis not present

## 2018-03-13 ENCOUNTER — Other Ambulatory Visit: Payer: Self-pay | Admitting: Internal Medicine

## 2018-03-13 DIAGNOSIS — I700 Atherosclerosis of aorta: Secondary | ICD-10-CM | POA: Insufficient documentation

## 2018-03-13 DIAGNOSIS — R030 Elevated blood-pressure reading, without diagnosis of hypertension: Secondary | ICD-10-CM | POA: Diagnosis not present

## 2018-03-13 DIAGNOSIS — K219 Gastro-esophageal reflux disease without esophagitis: Secondary | ICD-10-CM | POA: Diagnosis not present

## 2018-03-13 DIAGNOSIS — Z0000 Encounter for general adult medical examination without abnormal findings: Secondary | ICD-10-CM | POA: Diagnosis not present

## 2018-03-13 DIAGNOSIS — E7800 Pure hypercholesterolemia, unspecified: Secondary | ICD-10-CM | POA: Diagnosis not present

## 2018-03-13 DIAGNOSIS — Z1231 Encounter for screening mammogram for malignant neoplasm of breast: Secondary | ICD-10-CM | POA: Diagnosis not present

## 2018-03-13 DIAGNOSIS — M810 Age-related osteoporosis without current pathological fracture: Secondary | ICD-10-CM | POA: Diagnosis not present

## 2018-03-28 DIAGNOSIS — H8110 Benign paroxysmal vertigo, unspecified ear: Secondary | ICD-10-CM | POA: Diagnosis not present

## 2018-04-03 ENCOUNTER — Ambulatory Visit
Admission: RE | Admit: 2018-04-03 | Discharge: 2018-04-03 | Disposition: A | Discharge status: Home / Self Care | Payer: PPO | Source: Ambulatory Visit | Attending: Internal Medicine | Admitting: Internal Medicine

## 2018-04-03 DIAGNOSIS — Z1231 Encounter for screening mammogram for malignant neoplasm of breast: Secondary | ICD-10-CM

## 2018-05-14 DIAGNOSIS — D225 Melanocytic nevi of trunk: Secondary | ICD-10-CM | POA: Diagnosis not present

## 2018-05-14 DIAGNOSIS — D485 Neoplasm of uncertain behavior of skin: Secondary | ICD-10-CM | POA: Diagnosis not present

## 2018-05-14 DIAGNOSIS — D2262 Melanocytic nevi of left upper limb, including shoulder: Secondary | ICD-10-CM | POA: Diagnosis not present

## 2018-05-14 DIAGNOSIS — X32XXXA Exposure to sunlight, initial encounter: Secondary | ICD-10-CM | POA: Diagnosis not present

## 2018-05-14 DIAGNOSIS — D2261 Melanocytic nevi of right upper limb, including shoulder: Secondary | ICD-10-CM | POA: Diagnosis not present

## 2018-05-14 DIAGNOSIS — D0462 Carcinoma in situ of skin of left upper limb, including shoulder: Secondary | ICD-10-CM | POA: Diagnosis not present

## 2018-05-14 DIAGNOSIS — Z08 Encounter for follow-up examination after completed treatment for malignant neoplasm: Secondary | ICD-10-CM | POA: Diagnosis not present

## 2018-05-14 DIAGNOSIS — L570 Actinic keratosis: Secondary | ICD-10-CM | POA: Diagnosis not present

## 2018-05-14 DIAGNOSIS — Z85828 Personal history of other malignant neoplasm of skin: Secondary | ICD-10-CM | POA: Diagnosis not present

## 2018-05-28 ENCOUNTER — Encounter: Payer: Self-pay | Admitting: Urology

## 2018-05-28 ENCOUNTER — Ambulatory Visit (INDEPENDENT_AMBULATORY_CARE_PROVIDER_SITE_OTHER): Payer: PPO | Admitting: Urology

## 2018-05-28 VITALS — BP 123/78 | HR 78 | Ht 62.0 in | Wt 136.7 lb

## 2018-05-28 DIAGNOSIS — Z87442 Personal history of urinary calculi: Secondary | ICD-10-CM | POA: Diagnosis not present

## 2018-05-28 DIAGNOSIS — Z87440 Personal history of urinary (tract) infections: Secondary | ICD-10-CM | POA: Diagnosis not present

## 2018-05-28 DIAGNOSIS — Z87448 Personal history of other diseases of urinary system: Secondary | ICD-10-CM

## 2018-05-28 DIAGNOSIS — N952 Postmenopausal atrophic vaginitis: Secondary | ICD-10-CM

## 2018-05-28 LAB — URINALYSIS, COMPLETE
Bilirubin, UA: NEGATIVE
Glucose, UA: NEGATIVE
Ketones, UA: NEGATIVE
LEUKOCYTES UA: NEGATIVE
Nitrite, UA: NEGATIVE
Protein, UA: NEGATIVE
RBC, UA: NEGATIVE
Specific Gravity, UA: 1.015 (ref 1.005–1.030)
Urobilinogen, Ur: 0.2 mg/dL (ref 0.2–1.0)
pH, UA: 7 (ref 5.0–7.5)

## 2018-05-28 LAB — MICROSCOPIC EXAMINATION
RBC MICROSCOPIC, UA: NONE SEEN /HPF (ref 0–2)
WBC, UA: NONE SEEN /hpf (ref 0–5)

## 2018-05-28 MED ORDER — ESTRADIOL 0.1 MG/GM VA CREA: TOPICAL_CREAM | VAGINAL | 12 refills | Status: DC

## 2018-05-28 NOTE — Progress Notes (Signed)
05/28/2018 8:44 AM   Kelly Rivas 04-20-1941 [id number]  Referring provider: Kirk Ruths, MD [facility] [place], [place] [address]  Chief Complaint  Patient presents with  . Follow-up    HPI: 77 yo WF with a history of hematuria, atrophic vaginitis, history of nephrolithiasis and a history of recurrent UTI's who presents today for a one year recheck.    History of hematuria CTU and cystoscopy in 05/2017 - left renal cyst.  No reports of gross hematuria.  UA today negative.    Vaginal atrophy Using cream three nights weekly.  History of nephrolithiasis No stones seen on 04/2017 CTU.  No reports of flank pain.  Passed a stone in 02/2018.    History of rUTI's Risk factors: age and vaginal atrophy.  Last documented UTI in 11/2017 with pan sensitive E.Coli.  UA was suspicious for infection, no culture was performed, given Septra and symptoms resolved.  She is taking cranberry tablets.    Urinary complaint today is nocturia x 2.  Not bothersome.     PMH: Past Medical History:  Diagnosis Date  . Arthritis   . Atrophic vaginitis   . Constipation   . Dysuria   . Flank pain   . GERD (gastroesophageal reflux disease)   . History of recurrent UTIs   . HLD (hyperlipidemia)     Surgical History: Past Surgical History:  Procedure Laterality Date  . ABDOMINAL HYSTERECTOMY    . BREAST BIOPSY Bilateral 1970's  . BREAST SURGERY    . COLONOSCOPY N/A   . COLONOSCOPY WITH PROPOFOL N/A 03/24/2015   Procedure: COLONOSCOPY WITH PROPOFOL;  Surgeon: Hulen Luster, MD;  Location: [facility] ENDOSCOPY;  Service: Gastroenterology;  Laterality: N/A;  . MOHS SURGERY      Home Medications:  Allergies as of 05/28/2018      Reactions   Nitrofurantoin       Medication List        Accurate as of 05/28/18  8:44 AM. Always use your most recent med list.          aspirin EC 81 MG tablet Take by mouth.   Calcium Carbonate-Vitamin D 600-400 MG-UNIT  tablet Take by mouth.   estradiol 0.1 MG/GM vaginal cream Commonly known as:  ESTRACE Apply 0.5mg  (pea-sized amount)  just inside the vaginal introitus with a finger-tip on Monday, Wednesday and Friday nights.   fluticasone 50 MCG/ACT nasal spray Commonly known as:  FLONASE Place into the nose.   omeprazole 20 MG capsule Commonly known as:  PRILOSEC Take by mouth.   pravastatin 40 MG tablet Commonly known as:  PRAVACHOL TAKE 1 TABLET EVERY DAY   PRESERVISION AREDS PO Take by mouth.       Allergies:  Allergies  Allergen Reactions  . Nitrofurantoin     Family History: Family History  Problem Relation Age of Onset  . Diabetes Mellitus II Mother   . Breast cancer Sister   . Kidney disease Neg Hx   . Bladder Cancer Neg Hx   . Kidney cancer Neg Hx     Social History:  reports that she has never smoked. She has never used smokeless tobacco. She reports that she does not drink alcohol or use drugs.  ROS: UROLOGY Frequent Urination?: No Hard to postpone urination?: No Burning/pain with urination?: No Get up at night to urinate?: Yes Leakage of urine?: No Urine stream starts and stops?: No Trouble starting stream?: No Do you have to  strain to urinate?: No Blood in urine?: No Urinary tract infection?: Yes Sexually transmitted disease?: No Injury to kidneys or bladder?: No Painful intercourse?: No Weak stream?: No Currently pregnant?: No Vaginal bleeding?: No Last menstrual period?: n  Gastrointestinal Nausea?: No Vomiting?: No Indigestion/heartburn?: Yes Diarrhea?: No Constipation?: No  Constitutional Fever: No Night sweats?: No Weight loss?: No Fatigue?: No  Skin Skin rash/lesions?: No Itching?: No  Eyes Blurred vision?: No Double vision?: No  Ears/Nose/Throat Sore throat?: No Sinus problems?: No  Hematologic/Lymphatic Swollen glands?: No Easy bruising?: No  Cardiovascular Leg swelling?: No Chest pain?: No  Respiratory Cough?:  No Shortness of breath?: No  Endocrine Excessive thirst?: No  Musculoskeletal Back pain?: No Joint pain?: Yes  Neurological Headaches?: No Dizziness?: No  Psychologic Depression?: No Anxiety?: No   Physical Exam: BP 123/78 (BP Location: Left Arm, Patient Position: Sitting, Cuff Size: Normal)   Pulse 78   Ht 5\' 2"  (1.575 m)   Wt 136 lb 11.2 oz (62 kg)   BMI 25.00 kg/m   Constitutional: Well nourished. Alert and oriented, No acute distress. HEENT: [place] AT, moist mucus membranes. Trachea midline, no masses. Cardiovascular: No clubbing, cyanosis, or edema. Respiratory: Normal respiratory effort, no increased work of breathing. Skin: No rashes, bruises or suspicious lesions. Lymph: No cervical or inguinal adenopathy. Neurologic: Grossly intact, no focal deficits, moving all 4 extremities. Psychiatric: Normal mood and affect.   Assessment & Plan:    1. History of hematuria Hematuria work up completed in 10/28 - findings positive for renal cyst No report of gross hematuria  UA today is negative RTC in one year for UA - patient to report any gross hematuria in the interim    2. Atrophic vaginitis Apply cream three nights weekly, refill given  3. History of recurrent UTI's Patient states that she has had 2 urinary tract infections as it was She is currently taking cranberry tablets and applying the vaginal cream  4. Nephrolithiasis Patient passed a small stone in July and it was not associated with flank pain or hematuria   Return in about 1 year (around 05/29/2019) for UA and office visit .  Zara Council, PA-C  [facility] [address] [place], [place] [address] [phone]

## 2018-05-30 DIAGNOSIS — Z23 Encounter for immunization: Secondary | ICD-10-CM | POA: Diagnosis not present

## 2018-07-01 DIAGNOSIS — D0462 Carcinoma in situ of skin of left upper limb, including shoulder: Secondary | ICD-10-CM | POA: Diagnosis not present

## 2018-08-12 DIAGNOSIS — H26493 Other secondary cataract, bilateral: Secondary | ICD-10-CM | POA: Diagnosis not present

## 2018-08-12 DIAGNOSIS — H353131 Nonexudative age-related macular degeneration, bilateral, early dry stage: Secondary | ICD-10-CM | POA: Diagnosis not present

## 2018-08-12 DIAGNOSIS — Z961 Presence of intraocular lens: Secondary | ICD-10-CM | POA: Diagnosis not present

## 2018-09-09 DIAGNOSIS — K219 Gastro-esophageal reflux disease without esophagitis: Secondary | ICD-10-CM | POA: Diagnosis not present

## 2018-09-09 DIAGNOSIS — R030 Elevated blood-pressure reading, without diagnosis of hypertension: Secondary | ICD-10-CM | POA: Diagnosis not present

## 2018-09-09 DIAGNOSIS — E7800 Pure hypercholesterolemia, unspecified: Secondary | ICD-10-CM | POA: Diagnosis not present

## 2018-09-16 DIAGNOSIS — I700 Atherosclerosis of aorta: Secondary | ICD-10-CM | POA: Diagnosis not present

## 2018-09-16 DIAGNOSIS — R030 Elevated blood-pressure reading, without diagnosis of hypertension: Secondary | ICD-10-CM | POA: Diagnosis not present

## 2018-09-16 DIAGNOSIS — M810 Age-related osteoporosis without current pathological fracture: Secondary | ICD-10-CM | POA: Diagnosis not present

## 2018-09-16 DIAGNOSIS — E7800 Pure hypercholesterolemia, unspecified: Secondary | ICD-10-CM | POA: Diagnosis not present

## 2018-09-26 DIAGNOSIS — N3000 Acute cystitis without hematuria: Secondary | ICD-10-CM | POA: Diagnosis not present

## 2018-09-26 DIAGNOSIS — R030 Elevated blood-pressure reading, without diagnosis of hypertension: Secondary | ICD-10-CM | POA: Diagnosis not present

## 2018-09-26 DIAGNOSIS — R300 Dysuria: Secondary | ICD-10-CM | POA: Diagnosis not present

## 2018-10-14 DIAGNOSIS — R399 Unspecified symptoms and signs involving the genitourinary system: Secondary | ICD-10-CM | POA: Diagnosis not present

## 2018-10-31 DIAGNOSIS — L570 Actinic keratosis: Secondary | ICD-10-CM | POA: Diagnosis not present

## 2018-10-31 DIAGNOSIS — R58 Hemorrhage, not elsewhere classified: Secondary | ICD-10-CM | POA: Diagnosis not present

## 2018-10-31 DIAGNOSIS — X32XXXA Exposure to sunlight, initial encounter: Secondary | ICD-10-CM | POA: Diagnosis not present

## 2018-10-31 DIAGNOSIS — L538 Other specified erythematous conditions: Secondary | ICD-10-CM | POA: Diagnosis not present

## 2018-10-31 DIAGNOSIS — L820 Inflamed seborrheic keratosis: Secondary | ICD-10-CM | POA: Diagnosis not present

## 2018-10-31 DIAGNOSIS — Z08 Encounter for follow-up examination after completed treatment for malignant neoplasm: Secondary | ICD-10-CM | POA: Diagnosis not present

## 2018-10-31 DIAGNOSIS — Z85828 Personal history of other malignant neoplasm of skin: Secondary | ICD-10-CM | POA: Diagnosis not present

## 2018-11-25 DIAGNOSIS — M1612 Unilateral primary osteoarthritis, left hip: Secondary | ICD-10-CM | POA: Diagnosis not present

## 2018-11-25 DIAGNOSIS — E7800 Pure hypercholesterolemia, unspecified: Secondary | ICD-10-CM | POA: Diagnosis not present

## 2018-11-25 DIAGNOSIS — K219 Gastro-esophageal reflux disease without esophagitis: Secondary | ICD-10-CM | POA: Diagnosis not present

## 2018-11-25 DIAGNOSIS — R1032 Left lower quadrant pain: Secondary | ICD-10-CM | POA: Diagnosis not present

## 2018-11-25 DIAGNOSIS — M810 Age-related osteoporosis without current pathological fracture: Secondary | ICD-10-CM | POA: Diagnosis not present

## 2018-11-25 DIAGNOSIS — I700 Atherosclerosis of aorta: Secondary | ICD-10-CM | POA: Diagnosis not present

## 2018-12-02 DIAGNOSIS — D500 Iron deficiency anemia secondary to blood loss (chronic): Secondary | ICD-10-CM | POA: Diagnosis not present

## 2018-12-02 DIAGNOSIS — M533 Sacrococcygeal disorders, not elsewhere classified: Secondary | ICD-10-CM | POA: Diagnosis not present

## 2018-12-02 DIAGNOSIS — E039 Hypothyroidism, unspecified: Secondary | ICD-10-CM | POA: Diagnosis not present

## 2018-12-02 DIAGNOSIS — Z23 Encounter for immunization: Secondary | ICD-10-CM | POA: Diagnosis not present

## 2018-12-02 DIAGNOSIS — E782 Mixed hyperlipidemia: Secondary | ICD-10-CM | POA: Diagnosis not present

## 2018-12-02 DIAGNOSIS — E538 Deficiency of other specified B group vitamins: Secondary | ICD-10-CM | POA: Diagnosis not present

## 2018-12-02 DIAGNOSIS — Z0000 Encounter for general adult medical examination without abnormal findings: Secondary | ICD-10-CM | POA: Diagnosis not present

## 2019-03-09 ENCOUNTER — Other Ambulatory Visit: Payer: Self-pay | Admitting: Internal Medicine

## 2019-03-09 DIAGNOSIS — Z1231 Encounter for screening mammogram for malignant neoplasm of breast: Secondary | ICD-10-CM

## 2019-03-11 DIAGNOSIS — E7800 Pure hypercholesterolemia, unspecified: Secondary | ICD-10-CM | POA: Diagnosis not present

## 2019-03-11 DIAGNOSIS — I700 Atherosclerosis of aorta: Secondary | ICD-10-CM | POA: Diagnosis not present

## 2019-03-18 DIAGNOSIS — R350 Frequency of micturition: Secondary | ICD-10-CM | POA: Diagnosis not present

## 2019-03-18 DIAGNOSIS — Z0000 Encounter for general adult medical examination without abnormal findings: Secondary | ICD-10-CM | POA: Diagnosis not present

## 2019-03-18 DIAGNOSIS — R030 Elevated blood-pressure reading, without diagnosis of hypertension: Secondary | ICD-10-CM | POA: Diagnosis not present

## 2019-03-18 DIAGNOSIS — I700 Atherosclerosis of aorta: Secondary | ICD-10-CM | POA: Diagnosis not present

## 2019-03-18 DIAGNOSIS — E7800 Pure hypercholesterolemia, unspecified: Secondary | ICD-10-CM | POA: Diagnosis not present

## 2019-03-18 DIAGNOSIS — K219 Gastro-esophageal reflux disease without esophagitis: Secondary | ICD-10-CM | POA: Diagnosis not present

## 2019-04-15 ENCOUNTER — Ambulatory Visit
Admission: RE | Admit: 2019-04-15 | Discharge: 2019-04-15 | Disposition: A | Discharge status: Home / Self Care | Payer: PPO | Source: Ambulatory Visit | Attending: Internal Medicine | Admitting: Internal Medicine

## 2019-04-15 DIAGNOSIS — Z1231 Encounter for screening mammogram for malignant neoplasm of breast: Secondary | ICD-10-CM | POA: Insufficient documentation

## 2019-05-26 NOTE — Progress Notes (Signed)
05/27/2019 8:44 AM   Kelly Rivas [birth year] [id number]  Referring provider: Kirk Ruths, MD [facility] [place],  [place] [address]  Chief Complaint  Patient presents with  . Hematuria    HPI: 78 yo female with a history of hematuria, atrophic vaginitis, history of nephrolithiasis and a history of recurrent UTI's who presents today for a one year recheck.    History of hematuria (high risk) Non-smoker.  CTU in 04/2017 revealed normal adrenals. No renal stones. No hydronephrosis. 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 Subcentimeter hypodense renal cortical lesions in the posterior lower left kidney, too small to characterize, for which no follow-up is required. No additional renal cortical masses. Normal caliber ureters, with no ureteral stones. On delayed imaging, there is no urothelial wall thickening and there are no filling defects in the opacified portions of the bilateral collecting systems or ureters. No bladder stones, wall thickening, masses or diverticula.  Cystoscopy in 05/2017 with Dr. Pilar Jarvis was NED.   She does not report gross hematuria.  Her UA is negative for microscopic hematuria.       Vaginal atrophy Using cream three nights weekly.  History of nephrolithiasis No stones seen on 04/2017 CTU.  No reports of flank pain.  Passed a stone in 02/2018.    History of rUTI's Risk factors: age and vaginal atrophy.   + E. Coli resistant to ampicillin and trimethoprim/sulfa on 09/29/2018 She is taking cranberry tablets for 8 months and feels it is of benefit  Today, she is complaining of frequency x q 2 hours and nocturia x 2-3.  This is been happing for the last year.  She is also having occasional leakage with walking.  Patient denies any gross  hematuria, dysuria or suprapubic/flank pain.  Patient denies any fevers, chills, nausea or vomiting.     PMH: Past Medical History:  Diagnosis Date  . Arthritis   . Atrophic vaginitis   . Constipation   . Dysuria   . Flank pain   . GERD (gastroesophageal reflux disease)   . History of recurrent UTIs   . HLD (hyperlipidemia)     Surgical History: Past Surgical History:  Procedure Laterality Date  . ABDOMINAL HYSTERECTOMY    . BREAST EXCISIONAL BIOPSY Bilateral 1970's  . BREAST SURGERY    . COLONOSCOPY N/A   . COLONOSCOPY WITH PROPOFOL N/A 03/24/2015   Procedure: COLONOSCOPY WITH PROPOFOL;  Surgeon: Hulen Luster, MD;  Location: [facility] END[facility]COPY;  Service: Gastroenterology;  Laterality: N/A;  . MOHS SURGERY      Home Medications:  Allergies as of 05/27/2019      Reactions   Nitrofurantoin       Medication List       Accurate as of May 27, 2019  8:44 AM. If you have any questions, ask your nurse or doctor.        aspirin EC 81 MG tablet Take by mouth.   Calcium Carbonate-Vitamin D 600-400 MG-UNIT tablet Take by mouth.   Cranberry 500 MG Tabs Take by mouth.   estradiol 0.1 MG/GM vaginal cream Commonly known as: ESTRACE Apply 0.5mg  (pea-sized amount)  just inside the vaginal introitus with a finger-tip on Monday, Wednesday and Friday nights.   Fish Oil 1000 MG Caps Take by mouth.   fluticasone 50 MCG/ACT nasal  spray Commonly known as: FLONASE Place into the nose.   Metamucil 48.57 % Powd Generic drug: Psyllium Take by mouth.   mirabegron ER 25 MG Tb24 tablet Commonly known as: MYRBETRIQ Take 1 tablet (25 mg total) by mouth daily. Started by: Zara Council, PA-C   omeprazole 20 MG capsule Commonly known as: PRILOSEC Take by mouth.   pravastatin 40 MG tablet Commonly known as: PRAVACHOL TAKE 1 TABLET EVERY DAY   PRESERVISION AREDS PO Take by mouth.       Allergies:  Allergies  Allergen Reactions  . Nitrofurantoin     Family History:  Family History  Problem Relation Age of Onset  . Diabetes Mellitus II Mother   . Breast cancer Sister   . Kidney disease Neg Hx   . Bladder Cancer Neg Hx   . Kidney cancer Neg Hx     Social History:  reports that she has never smoked. She has never used smokeless tobacco. She reports that she does not drink alcohol or use drugs.  ROS: UROLOGY Frequent Urination?: Yes Hard to postpone urination?: No Burning/pain with urination?: No Get up at night to urinate?: Yes Leakage of urine?: No Urine stream starts and stops?: No Trouble starting stream?: No Do you have to strain to urinate?: No Blood in urine?: No Urinary tract infection?: No Sexually transmitted disease?: No Injury to kidneys or bladder?: No Painful intercourse?: No Weak stream?: No Currently pregnant?: No Vaginal bleeding?: No Last menstrual period?: n  Gastrointestinal Nausea?: No Vomiting?: No Indigestion/heartburn?: Yes Diarrhea?: No Constipation?: No  Constitutional Fever: No Night sweats?: No Weight loss?: No Fatigue?: No  Skin Skin rash/lesions?: No Itching?: No  Eyes Blurred vision?: No Double vision?: No  Ears/Nose/Throat Sore throat?: No Sinus problems?: No  Hematologic/Lymphatic Swollen glands?: No Easy bruising?: No  Cardiovascular Leg swelling?: No Chest pain?: No  Respiratory Cough?: No Shortness of breath?: No  Endocrine Excessive thirst?: No  Musculoskeletal Back pain?: No Joint pain?: Yes  Neurological Headaches?: No Dizziness?: No  Psychologic Depression?: No Anxiety?: No   Physical Exam: BP (!) 143/83 (BP Location: Left Arm, Patient Position: Sitting, Cuff Size: Normal)   Pulse 81   Ht 5\' 2"  (1.575 m)   Wt 140 lb 9.6 oz (63.8 kg)   BMI 25.72 kg/m   Constitutional:  Well nourished. Alert and oriented, No acute distress. HEENT: [place] AT, moist mucus membranes.  Trachea midline, no masses. Cardiovascular: No clubbing, cyanosis, or edema. Respiratory:  Normal respiratory effort, no increased work of breathing. Neurologic: Grossly intact, no focal deficits, moving all 4 extremities. Psychiatric: Normal mood and affect.   Laboratory Data Urinalysis Component     Latest Ref Rng & Units 05/27/2019  Specific Gravity, UA     1.005 - 1.030 <1.005 (L)  pH, UA     5.0 - 7.5 5.5  Color, UA     Yellow Yellow  Appearance Ur     Clear Cloudy (A)  Leukocytes,UA     Negative 2+ (A)  Protein,UA     Negative/Trace Negative  Glucose, UA     Negative Negative  Ketones, UA     Negative Negative  RBC, UA     Negative Negative  Bilirubin, UA     Negative Negative  Urobilinogen, Ur     0.2 - 1.0 mg/dL 0.2  Nitrite, UA     Negative Negative  Microscopic Examination      See below:   Component     Latest Ref Rng &  Units 05/27/2019  WBC, UA     0 - 5 /hpf 11-30 (A)  RBC     0 - 2 /hpf None seen  Epithelial Cells (non renal)     0 - 10 /hpf 0-10  Renal Epithel, UA     None seen /hpf 0-10 (A)  Bacteria, UA     None seen/Few Few   I have reviewed the labs.  Assessment & Plan:    1. History of hematuria (high risk) Hematuria work up completed in 10/28 - findings positive for renal cyst No report of gross hematuria  UA today negative for microscopic hematuria  RTC in one year for UA - patient to report any gross hematuria in the interim    2. Atrophic vaginitis Apply vaginal estrogen cream three nights weekly, refill given  3. History of recurrent UTI's Asymptomatic  4. Nephrolithiasis No passage of fragments since last visit   5. OAB Offered medical therapy with beta-3 adrenergic receptor agonist - would like to try the beta-3 adrenergic receptor agonist (Myrbetriq).  Given Myrbetriq 25 mg samples, #28.  I have reviewed with the patient of the side effects of Myrbetriq, such as: elevation in BP, urinary retention and/or HA.   RTC in 3 weeks for PVR and OAB questionnaire  Return in about 3 weeks (around 06/17/2019) for PVR and  OAB questionnaire.  Zara Council, PA-C  [facility] [address] [place], [place] [address] [phone]

## 2019-05-27 ENCOUNTER — Other Ambulatory Visit: Payer: Self-pay

## 2019-05-27 ENCOUNTER — Ambulatory Visit: Payer: PPO | Admitting: Urology

## 2019-05-27 ENCOUNTER — Encounter: Payer: Self-pay | Admitting: Urology

## 2019-05-27 VITALS — BP 143/83 | HR 81 | Ht 62.0 in | Wt 140.6 lb

## 2019-05-27 DIAGNOSIS — Z87442 Personal history of urinary calculi: Secondary | ICD-10-CM | POA: Diagnosis not present

## 2019-05-27 DIAGNOSIS — Z87448 Personal history of other diseases of urinary system: Secondary | ICD-10-CM | POA: Diagnosis not present

## 2019-05-27 DIAGNOSIS — Z87440 Personal history of urinary (tract) infections: Secondary | ICD-10-CM | POA: Diagnosis not present

## 2019-05-27 DIAGNOSIS — N952 Postmenopausal atrophic vaginitis: Secondary | ICD-10-CM

## 2019-05-27 LAB — URINALYSIS, COMPLETE
Bilirubin, UA: NEGATIVE
Glucose, UA: NEGATIVE
Ketones, UA: NEGATIVE
Nitrite, UA: NEGATIVE
Protein,UA: NEGATIVE
RBC, UA: NEGATIVE
Specific Gravity, UA: <1.005 — ABNORMAL LOW (ref 1.005–1.030)
Urobilinogen, Ur: 0.2 mg/dL (ref 0.2–1.0)
pH, UA: 5.5 (ref 5.0–7.5)

## 2019-05-27 LAB — MICROSCOPIC EXAMINATION: RBC, Urine: NONE SEEN /hpf (ref 0–2)

## 2019-05-27 MED ORDER — MIRABEGRON ER 25 MG PO TB24: 25.0000 mg | ORAL_TABLET | Freq: Every day | ORAL | 0 refills | Status: DC

## 2019-06-16 NOTE — Progress Notes (Addendum)
06/17/2019 8:29 AM   Kelly Rivas 1940/10/23 [id number]  Referring provider: Kirk Ruths, MD [facility] [place],  [place] [address]  Chief Complaint  Patient presents with  . Over Active Bladder    HPI: 78 yo female with a history of hematuria, atrophic vaginitis, history of nephrolithiasis and a history of recurrent UTI's who presents today for 3 week recheck after a trial of Myrbetriq.    History of hematuria (high risk) Non-smoker.  CTU in 04/2017 revealed normal adrenals. No renal stones. No hydronephrosis. 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 Subcentimeter hypodense renal cortical lesions in the posterior lower left kidney, too small to characterize, for which no follow-up is required. No additional renal cortical masses. Normal caliber ureters, with no ureteral stones. On delayed imaging, there is no urothelial wall thickening and there are no filling defects in the opacified portions of the bilateral collecting systems or ureters. No bladder stones, wall thickening, masses or diverticula.  Cystoscopy in 05/2017 with Dr. Pilar Jarvis was NED.   She does not report gross hematuria.  Her UA 04/2019 negative for microscopic hematuria.       Vaginal atrophy Using cream three nights weekly.  History of nephrolithiasis No stones seen on 04/2017 CTU.  No reports of flank pain.  Passed a stone in 02/2018.    History of rUTI's Risk factors: age and vaginal atrophy.   + E. Coli resistant to ampicillin and trimethoprim/sulfa on 09/29/2018 She is taking cranberry tablets   OAB The patient is  experiencing urgency x 8 or more, frequency x 8 or more, not restricting fluids to avoid visits to the restroom, not engaging in toilet mapping, incontinence x 0-3 and nocturia x  0-3.   Her BP is 143/85.   Her PVR is 0 mL.   Her main concerns are frequency and nocturia.   Patient denies any gross hematuria, dysuria or suprapubic/flank pain.  Patient denies any fevers, chills, nausea or vomiting.  She felt a little bit of an improvement with the Myrbetriq 25 mg, but she is not at goal.    PMH: Past Medical History:  Diagnosis Date  . Arthritis   . Atrophic vaginitis   . Constipation   . Dysuria   . Flank pain   . GERD (gastroesophageal reflux disease)   . History of recurrent UTIs   . HLD (hyperlipidemia)     Surgical History: Past Surgical History:  Procedure Laterality Date  . ABDOMINAL HYSTERECTOMY    . BREAST EXCISIONAL BIOPSY Bilateral 1970's  . BREAST SURGERY    . COLONOSCOPY N/A   . COLONOSCOPY WITH PROPOFOL N/A 03/24/2015   Procedure: COLONOSCOPY WITH PROPOFOL;  Surgeon: Hulen Luster, MD;  Location: [facility] ENDOSCOPY;  Service: Gastroenterology;  Laterality: N/A;  . MOHS SURGERY      Home Medications:  Allergies as of 06/17/2019      Reactions   Nitrofurantoin       Medication List       Accurate as of June 17, 2019  8:29 AM. If you have any questions, ask your nurse or doctor.        aspirin EC 81 MG tablet Take by mouth.   Calcium Carbonate-Vitamin D 600-400 MG-UNIT tablet Take by mouth.   Cranberry 500 MG Tabs Take by mouth.  estradiol 0.1 MG/GM vaginal cream Commonly known as: ESTRACE Apply 0.5mg  (pea-sized amount)  just inside the vaginal introitus with a finger-tip on Monday, Wednesday and Friday nights.   Fish Oil 1000 MG Caps Take by mouth.   fluticasone 50 MCG/ACT nasal spray Commonly known as: FLONASE Place into the nose.   Metamucil 48.57 % Powd Generic drug: Psyllium Take by mouth.   mirabegron ER 25 MG Tb24 tablet Commonly known as: MYRBETRIQ Take 1 tablet (25 mg total) by mouth daily.   omeprazole 20 MG capsule Commonly known as: PRILOSEC Take by mouth.   pravastatin 40 MG tablet Commonly known as:  PRAVACHOL TAKE 1 TABLET EVERY DAY   PRESERVISION AREDS PO Take by mouth.       Allergies:  Allergies  Allergen Reactions  . Nitrofurantoin     Family History: Family History  Problem Relation Age of Onset  . Diabetes Mellitus II Mother   . Breast cancer Sister   . Kidney disease Neg Hx   . Bladder Cancer Neg Hx   . Kidney cancer Neg Hx     Social History:  reports that she has never smoked. She has never used smokeless tobacco. She reports that she does not drink alcohol or use drugs.  ROS: UROLOGY Frequent Urination?: Yes Hard to postpone urination?: No Burning/pain with urination?: No Get up at night to urinate?: Yes Leakage of urine?: No Urine stream starts and stops?: No Trouble starting stream?: No Do you have to strain to urinate?: No Blood in urine?: No Urinary tract infection?: No Sexually transmitted disease?: No Injury to kidneys or bladder?: No Painful intercourse?: No Weak stream?: No Currently pregnant?: No Vaginal bleeding?: No Last menstrual period?: n  Gastrointestinal Nausea?: No Vomiting?: No Indigestion/heartburn?: Yes Diarrhea?: No Constipation?: No  Constitutional Fever: No Night sweats?: No Weight loss?: No Fatigue?: No  Skin Skin rash/lesions?: No Itching?: No  Eyes Blurred vision?: No Double vision?: No  Ears/Nose/Throat Sore throat?: No Sinus problems?: No  Hematologic/Lymphatic Swollen glands?: No Easy bruising?: No  Cardiovascular Leg swelling?: No Chest pain?: No  Respiratory Cough?: No Shortness of breath?: No  Endocrine Excessive thirst?: No  Musculoskeletal Back pain?: No Joint pain?: Yes  Neurological Headaches?: No Dizziness?: No  Psychologic Depression?: No Anxiety?: No   Physical Exam: BP (!) 143/85   Pulse 90   Ht 5\' 5"  (1.651 m)   Wt 143 lb (64.9 kg)   BMI 23.80 kg/m   Constitutional:  Well nourished. Alert and oriented, No acute distress. HEENT: [place] AT, moist mucus  membranes.  Trachea midline, no masses. Cardiovascular: No clubbing, cyanosis, or edema. Respiratory: Normal respiratory effort, no increased work of breathing. Neurologic: Grossly intact, no focal deficits, moving all 4 extremities. Psychiatric: Normal mood and affect.  Laboratory Data Urinalysis Component     Latest Ref Rng & Units 05/27/2019  Specific Gravity, UA     1.005 - 1.030 <1.005 (L)  pH, UA     5.0 - 7.5 5.5  Color, UA     Yellow Yellow  Appearance Ur     Clear Cloudy (A)  Leukocytes,UA     Negative 2+ (A)  Protein,UA     Negative/Trace Negative  Glucose, UA     Negative Negative  Ketones, UA     Negative Negative  RBC, UA     Negative Negative  Bilirubin, UA     Negative Negative  Urobilinogen, Ur     0.2 - 1.0 mg/dL 0.2  Nitrite, UA  Negative Negative  Microscopic Examination      See below:   Component     Latest Ref Rng & Units 05/27/2019  WBC, UA     0 - 5 /hpf 11-30 (A)  RBC     0 - 2 /hpf None seen  Epithelial Cells (non renal)     0 - 10 /hpf 0-10  Renal Epithel, UA     None seen /hpf 0-10 (A)  Bacteria, UA     None seen/Few Few   I have reviewed the labs.  Pertinent Imaging Results for KERBI, DUGUE ANN "Hara ANN" (MRN [id number]) as of 06/17/2019 08:51  Ref. Range 06/17/2019 08:20  Scan Result Unknown 50mL   Assessment & Plan:    1. History of hematuria (high risk) Hematuria work up completed in 10/28 - findings positive for renal cyst No report of gross hematuria  UA 05/27/2019 negative for microscopic hematuria RTC in one year for UA (04/2020)- patient to report any gross hematuria in the interim    2. Atrophic vaginitis Apply vaginal estrogen cream three nights weekly, refill given  3. History of recurrent UTI's Asymptomatic  4. Nephrolithiasis No passage of fragments since last visit   5. OAB Will have a trial of Myrbetriq 50 mg, #28 samples given If she feels the 50 mg tablets are benefit, she will return in 3 weeks  for PVR and OAB questionnaire If she does not feel an improvement with the 50 mg tablets, she will contact the office and we will schedule her for a yearly appointment in September 2021  Return in about 3 weeks (around 07/08/2019) for PVR and OAB questionnaire.  Zara Council, PA-C  [facility] [address] [place], [place] [address] [phone]

## 2019-06-17 ENCOUNTER — Other Ambulatory Visit: Payer: Self-pay

## 2019-06-17 ENCOUNTER — Encounter: Payer: Self-pay | Admitting: Urology

## 2019-06-17 ENCOUNTER — Ambulatory Visit: Payer: PPO | Admitting: Urology

## 2019-06-17 VITALS — BP 143/85 | HR 90 | Ht 65.0 in | Wt 143.0 lb

## 2019-06-17 DIAGNOSIS — Z87448 Personal history of other diseases of urinary system: Secondary | ICD-10-CM

## 2019-06-17 DIAGNOSIS — N3281 Overactive bladder: Secondary | ICD-10-CM | POA: Diagnosis not present

## 2019-06-17 LAB — BLADDER SCAN AMB NON-IMAGING

## 2019-06-17 MED ORDER — MIRABEGRON ER 50 MG PO TB24: 50.0000 mg | ORAL_TABLET | Freq: Every day | ORAL | 0 refills | Status: DC

## 2019-07-08 ENCOUNTER — Ambulatory Visit: Payer: PPO | Admitting: Urology

## 2019-08-03 DIAGNOSIS — D2262 Melanocytic nevi of left upper limb, including shoulder: Secondary | ICD-10-CM | POA: Diagnosis not present

## 2019-08-03 DIAGNOSIS — X32XXXA Exposure to sunlight, initial encounter: Secondary | ICD-10-CM | POA: Diagnosis not present

## 2019-08-03 DIAGNOSIS — D2271 Melanocytic nevi of right lower limb, including hip: Secondary | ICD-10-CM | POA: Diagnosis not present

## 2019-08-03 DIAGNOSIS — D2261 Melanocytic nevi of right upper limb, including shoulder: Secondary | ICD-10-CM | POA: Diagnosis not present

## 2019-08-03 DIAGNOSIS — L570 Actinic keratosis: Secondary | ICD-10-CM | POA: Diagnosis not present

## 2019-08-03 DIAGNOSIS — Z85828 Personal history of other malignant neoplasm of skin: Secondary | ICD-10-CM | POA: Diagnosis not present

## 2019-08-03 DIAGNOSIS — D225 Melanocytic nevi of trunk: Secondary | ICD-10-CM | POA: Diagnosis not present

## 2019-08-14 DIAGNOSIS — H26493 Other secondary cataract, bilateral: Secondary | ICD-10-CM | POA: Diagnosis not present

## 2019-08-14 DIAGNOSIS — Z961 Presence of intraocular lens: Secondary | ICD-10-CM | POA: Diagnosis not present

## 2019-08-14 DIAGNOSIS — H5203 Hypermetropia, bilateral: Secondary | ICD-10-CM | POA: Diagnosis not present

## 2019-08-14 DIAGNOSIS — H353131 Nonexudative age-related macular degeneration, bilateral, early dry stage: Secondary | ICD-10-CM | POA: Diagnosis not present

## 2019-09-15 DIAGNOSIS — E7800 Pure hypercholesterolemia, unspecified: Secondary | ICD-10-CM | POA: Diagnosis not present

## 2019-09-15 DIAGNOSIS — I700 Atherosclerosis of aorta: Secondary | ICD-10-CM | POA: Diagnosis not present

## 2019-09-15 DIAGNOSIS — R030 Elevated blood-pressure reading, without diagnosis of hypertension: Secondary | ICD-10-CM | POA: Diagnosis not present

## 2019-09-22 DIAGNOSIS — R030 Elevated blood-pressure reading, without diagnosis of hypertension: Secondary | ICD-10-CM | POA: Diagnosis not present

## 2019-09-22 DIAGNOSIS — I700 Atherosclerosis of aorta: Secondary | ICD-10-CM | POA: Diagnosis not present

## 2019-09-22 DIAGNOSIS — E7800 Pure hypercholesterolemia, unspecified: Secondary | ICD-10-CM | POA: Diagnosis not present

## 2019-09-22 DIAGNOSIS — M810 Age-related osteoporosis without current pathological fracture: Secondary | ICD-10-CM | POA: Diagnosis not present

## 2020-01-06 ENCOUNTER — Other Ambulatory Visit: Payer: Self-pay | Admitting: Physician Assistant

## 2020-01-06 ENCOUNTER — Other Ambulatory Visit: Payer: Self-pay

## 2020-01-06 ENCOUNTER — Ambulatory Visit
Admission: RE | Admit: 2020-01-06 | Discharge: 2020-01-06 | Disposition: A | Discharge status: Home / Self Care | Payer: PPO | Source: Ambulatory Visit | Attending: Physician Assistant | Admitting: Physician Assistant

## 2020-01-06 DIAGNOSIS — R42 Dizziness and giddiness: Secondary | ICD-10-CM | POA: Insufficient documentation

## 2020-01-06 DIAGNOSIS — H538 Other visual disturbances: Secondary | ICD-10-CM

## 2020-01-06 DIAGNOSIS — R519 Headache, unspecified: Secondary | ICD-10-CM | POA: Insufficient documentation

## 2020-01-06 DIAGNOSIS — R110 Nausea: Secondary | ICD-10-CM | POA: Diagnosis not present

## 2020-01-14 DIAGNOSIS — H26493 Other secondary cataract, bilateral: Secondary | ICD-10-CM | POA: Diagnosis not present

## 2020-01-14 DIAGNOSIS — H353131 Nonexudative age-related macular degeneration, bilateral, early dry stage: Secondary | ICD-10-CM | POA: Diagnosis not present

## 2020-01-14 DIAGNOSIS — Z961 Presence of intraocular lens: Secondary | ICD-10-CM | POA: Diagnosis not present

## 2020-03-07 DIAGNOSIS — H353132 Nonexudative age-related macular degeneration, bilateral, intermediate dry stage: Secondary | ICD-10-CM | POA: Diagnosis not present

## 2020-03-07 DIAGNOSIS — H26492 Other secondary cataract, left eye: Secondary | ICD-10-CM | POA: Diagnosis not present

## 2020-03-07 DIAGNOSIS — H18593 Other hereditary corneal dystrophies, bilateral: Secondary | ICD-10-CM | POA: Diagnosis not present

## 2020-03-07 DIAGNOSIS — H26493 Other secondary cataract, bilateral: Secondary | ICD-10-CM | POA: Diagnosis not present

## 2020-03-15 DIAGNOSIS — R030 Elevated blood-pressure reading, without diagnosis of hypertension: Secondary | ICD-10-CM | POA: Diagnosis not present

## 2020-03-15 DIAGNOSIS — E7800 Pure hypercholesterolemia, unspecified: Secondary | ICD-10-CM | POA: Diagnosis not present

## 2020-03-16 ENCOUNTER — Other Ambulatory Visit: Payer: Self-pay | Admitting: Internal Medicine

## 2020-03-16 DIAGNOSIS — Z1231 Encounter for screening mammogram for malignant neoplasm of breast: Secondary | ICD-10-CM

## 2020-03-22 DIAGNOSIS — M810 Age-related osteoporosis without current pathological fracture: Secondary | ICD-10-CM | POA: Diagnosis not present

## 2020-03-22 DIAGNOSIS — E7800 Pure hypercholesterolemia, unspecified: Secondary | ICD-10-CM | POA: Diagnosis not present

## 2020-03-22 DIAGNOSIS — R030 Elevated blood-pressure reading, without diagnosis of hypertension: Secondary | ICD-10-CM | POA: Diagnosis not present

## 2020-03-22 DIAGNOSIS — Z0000 Encounter for general adult medical examination without abnormal findings: Secondary | ICD-10-CM | POA: Diagnosis not present

## 2020-03-22 DIAGNOSIS — I700 Atherosclerosis of aorta: Secondary | ICD-10-CM | POA: Diagnosis not present

## 2020-04-07 DIAGNOSIS — H26491 Other secondary cataract, right eye: Secondary | ICD-10-CM | POA: Diagnosis not present

## 2020-04-19 ENCOUNTER — Ambulatory Visit
Admission: RE | Admit: 2020-04-19 | Discharge: 2020-04-19 | Disposition: A | Discharge status: Home / Self Care | Payer: PPO | Source: Ambulatory Visit | Attending: Internal Medicine | Admitting: Internal Medicine

## 2020-04-19 ENCOUNTER — Other Ambulatory Visit: Payer: Self-pay

## 2020-04-19 DIAGNOSIS — Z1231 Encounter for screening mammogram for malignant neoplasm of breast: Secondary | ICD-10-CM | POA: Insufficient documentation

## 2020-05-17 DIAGNOSIS — Z23 Encounter for immunization: Secondary | ICD-10-CM | POA: Diagnosis not present

## 2020-06-20 NOTE — Progress Notes (Signed)
06/21/2020 3:46 PM   Kelly Rivas Sep 29, 1940 [id number]  Referring provider: Kirk Ruths, MD [facility] Kelly Rivas,  Kelly Rivas [id number]  Chief Complaint  Patient presents with  . Over Active Bladder    HPI: 79 yo female with a history of hematuria, atrophic vaginitis, nephrolithiasis and recurrent UTI's who presents today for follow up.     High risk hematuria Non-smoker.  CTU in 04/2017 revealed normal adrenals. No renal stones. No hydronephrosis. 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 Subcentimeter hypodense renal cortical lesions in the posterior lower left kidney, too small to characterize, for which no follow-up is required. No additional renal cortical masses. Normal caliber ureters, with no ureteral stones. On delayed imaging, there is no urothelial wall thickening and there are no filling defects in the opacified portions of the bilateral collecting systems or ureters. No bladder stones, wall thickening, masses or diverticula.  Cystoscopy in 05/2017 with Dr. Pilar Jarvis was NED.   She does not endorse any gross hematuria over the last year.  UA is negative for micro heme.   Vaginal atrophy S/P hysterectomy.  Mammogram negative 03/2020.   She is applying the vaginal estrogen cream Monday, Wednesday and Friday nights.    Nephrolithiasis No stones seen on 04/2017 CTU.  Passed a stone in 02/2018.    History of rUTI's Risk factors: age and vaginal atrophy.   No documented infections over the last year She continues cranberry tablets, Vitamin C and estrogen cream.    OAB The patient is  experiencing urgency x 0-3 (improved), frequency x 8 or more (stable), not restricting fluids to avoid visits to the restroom, not engaging in toilet mapping, incontinence x 0-3  (stable) and nocturia x 0-3 (stable).   Her BP is 159/82.   Her PVR is 0 mL.     She attributes her urinary frequency to drinking a lot of water.  She takes a glass of water with her Metamucil, she takes a glass of water with her medication and of course she drinks liquids with that in between meals.  Patient denies any modifying or aggravating factors.  Patient denies any gross hematuria, dysuria or suprapubic/flank pain.  Patient denies any fevers, chills, nausea or vomiting.   PMH: Past Medical History:  Diagnosis Date  . Arthritis   . Atrophic vaginitis   . Constipation   . Dysuria   . Flank pain   . GERD (gastroesophageal reflux disease)   . History of recurrent UTIs   . HLD (hyperlipidemia)     Surgical History: Past Surgical History:  Procedure Laterality Date  . ABDOMINAL HYSTERECTOMY    . BREAST EXCISIONAL BIOPSY Bilateral 1970's  . BREAST SURGERY    . COLONOSCOPY N/A   . COLONOSCOPY WITH PROPOFOL N/A 03/24/2015   Procedure: COLONOSCOPY WITH PROPOFOL;  Surgeon: Hulen Luster, MD;  Location: [facility] ENDOSCOPY;  Service: Gastroenterology;  Laterality: N/A;  . MOHS SURGERY      Home Medications:  Allergies as of 06/21/2020      Reactions   Nitrofurantoin       Medication List       Accurate as of June 21, 2020 11:59 PM. If you have any questions, ask your nurse or doctor.        STOP taking these medications   mirabegron  ER 50 MG Tb24 tablet Commonly known as: MYRBETRIQ Stopped by: Zara Council, PA-C     TAKE these medications   aspirin EC 81 MG tablet Take by mouth.   Calcium Carbonate-Vitamin D 600-400 MG-UNIT tablet Take by mouth.   Cranberry 500 MG Tabs Take by mouth.   diclofenac Sodium 1 % Gel Commonly known as: VOLTAREN Apply topically.   estradiol 0.1 MG/GM vaginal cream Commonly known as: ESTRACE Apply 0.$RemoveBeforeD'5mg'OUmOkVyVvkmfYF$  (pea-sized amount)  just inside the vaginal introitus with a finger-tip on Monday, Wednesday and Friday nights.   famotidine 20 MG  tablet Commonly known as: PEPCID Take by mouth.   Fish Oil 1000 MG Caps Take by mouth.   fluticasone 50 MCG/ACT nasal spray Commonly known as: FLONASE Place into the nose.   Metamucil 48.57 % Powd Generic drug: Psyllium Take by mouth.   omeprazole 20 MG capsule Commonly known as: PRILOSEC Take by mouth.   pravastatin 40 MG tablet Commonly known as: PRAVACHOL TAKE 1 TABLET EVERY DAY   PRESERVISION AREDS PO Take by mouth.       Allergies:  Allergies  Allergen Reactions  . Nitrofurantoin     Family History: Family History  Problem Relation Age of Onset  . Diabetes Mellitus II Mother   . Breast cancer Sister   . Kidney disease Neg Hx   . Bladder Cancer Neg Hx   . Kidney cancer Neg Hx     Social History:  reports that she has never smoked. She has never used smokeless tobacco. She reports that she does not drink alcohol and does not use drugs.  ROS: For pertinent review of systems please refer to history of present illness  Physical Exam: BP (!) 159/82   Pulse 85   Ht $R'5\' 5"'Cg$  (1.651 m)   Wt 135 lb 9.6 oz (61.5 kg)   BMI 22.57 kg/m   Constitutional:  Well nourished. Alert and oriented, No acute distress. HEENT: [place] AT, mask in place.  Trachea midline Cardiovascular: No clubbing, cyanosis, or edema. Respiratory: Normal respiratory effort, no increased work of breathing. GU: No CVA tenderness.  No bladder fullness or masses.  Atrophic external genitalia, normal pubic hair distribution, no lesions.  Normal urethral meatus, no lesions, no prolapse, no discharge.   No urethral masses, tenderness and/or tenderness. No bladder fullness, tenderness or masses.  Pale vagina mucosa, fair estrogen effect, no discharge, no lesions, good pelvic support, no cystocele and no rectocele noted.   No adnexal/parametria masses or tenderness noted.  Anus and perineum are without rashes or lesions.   Skin: No rashes, bruises or suspicious lesions. Lymph: No inguinal  adenopathy. Neurologic: Grossly intact, no focal deficits, moving all 4 extremities. Psychiatric: Normal mood and affect.   Laboratory Data Urinalysis Component     Latest Ref Rng & Units 06/21/2020  Specific Gravity, UA     1.005 - 1.030 1.010  pH, UA     5.0 - 7.5 6.0  Color, UA     Yellow Yellow  Appearance Ur     Clear Cloudy (A)  Leukocytes,UA     Negative 2+ (A)  Protein,UA     Negative/Trace Negative  Glucose, UA     Negative Negative  Ketones, UA     Negative Negative  RBC, UA     Negative Trace (A)  Bilirubin, UA     Negative Negative  Urobilinogen, Ur     0.2 - 1.0 mg/dL 0.2  Nitrite, UA     Negative Negative  Microscopic Examination      See below:   Component     Latest Ref Rng & Units 06/21/2020  WBC, UA     0 - 5 /hpf >30 (A)  RBC     0 - 2 /hpf 0-2  Epithelial Cells (non renal)     0 - 10 /hpf >10 (A)  Renal Epithel, UA     None seen /hpf 0-10 (A)  Bacteria, UA     None seen/Few Moderate (A)     Specimen:  Blood  Ref Range & Units 3 mo ago  Glucose 70 - 110 mg/dL 91      Sodium 136 - 145 mmol/L 141      Potassium 3.6 - 5.1 mmol/L 4.5      Chloride 97 - 109 mmol/L 106      Carbon Dioxide (CO2) 22.0 - 32.0 mmol/L 32.1High      Urea Nitrogen (BUN) 7 - 25 mg/dL 14      Creatinine 0.6 - 1.1 mg/dL 0.8      Glomerular Filtration Rate (eGFR), MDRD Estimate >60 mL/min/1.73sq m 70      Calcium 8.7 - 10.3 mg/dL 9.1      AST  8 - 39 U/L 23      ALT  5 - 38 U/L 20      Alk Phos (alkaline Phosphatase) 34 - 104 U/L 46      Albumin 3.5 - 4.8 g/dL 4.0      Bilirubin, Total 0.3 - 1.2 mg/dL 0.7      Protein, Total 6.1 - 7.9 g/dL 6.5      A/G Ratio 1.0 - 5.0 gm/dL 1.6      Resulting Agency  [facility] - [facility]  Specimen Collected: 03/15/20 7:23 AM Last Resulted: 03/15/20 9:11 AM  Received From: [place] [facility]  Result Received: 03/16/20 9:25 AM  I have reviewed the labs.  Pertinent Imaging Results for  Kelly Rivas, Kelly Rivas "Lanaya Rivas" (MRN [id number]) as of 06/21/2020 08:23  Ref. Range 06/21/2020 08:20  Scan Result Unknown 0    Assessment & Plan:    1. History of hematuria (high risk) Hematuria work up completed in 10/28 - findings positive for renal cyst No report of gross hematuria  UA today is negative for micro heme RTC in one year for UA (05/2021)- patient to report any gross hematuria in the interim    2. Atrophic vaginitis Continue to apply vaginal estrogen cream Monday, Wednesday and Friday nights-refills given  3. History of recurrent UTI's Continue cranberry tablets, vitamin C and applying vaginal estrogen cream  4. Nephrolithiasis No passage of fragments since last visit   5. OAB Patient did not continue to her Myrbetriq due to elevation in blood pressure, headache and joint pain She is managing her OAB conservatively at this time and does not feel bothered by it She will return in 1 year for OAB questionnaire and PVR  Return in about 1 year (around 06/21/2021) for UA, OAB questionnaire, PVR and exam.  Zara Council, [facility]  [place] [address], [place] [address] [phone]

## 2020-06-21 ENCOUNTER — Other Ambulatory Visit: Payer: Self-pay

## 2020-06-21 ENCOUNTER — Ambulatory Visit: Payer: PPO | Admitting: Urology

## 2020-06-21 ENCOUNTER — Encounter: Payer: Self-pay | Admitting: Urology

## 2020-06-21 VITALS — BP 159/82 | HR 85 | Ht 65.0 in | Wt 135.6 lb

## 2020-06-21 DIAGNOSIS — N3281 Overactive bladder: Secondary | ICD-10-CM

## 2020-06-21 DIAGNOSIS — N952 Postmenopausal atrophic vaginitis: Secondary | ICD-10-CM

## 2020-06-21 DIAGNOSIS — R319 Hematuria, unspecified: Secondary | ICD-10-CM

## 2020-06-21 LAB — URINALYSIS, COMPLETE
Bilirubin, UA: NEGATIVE
Glucose, UA: NEGATIVE
Ketones, UA: NEGATIVE
Nitrite, UA: NEGATIVE
Protein,UA: NEGATIVE
Specific Gravity, UA: 1.01 (ref 1.005–1.030)
Urobilinogen, Ur: 0.2 mg/dL (ref 0.2–1.0)
pH, UA: 6 (ref 5.0–7.5)

## 2020-06-21 LAB — MICROSCOPIC EXAMINATION
Epithelial Cells (non renal): >10 /hpf — AB (ref 0–10)
WBC, UA: >30 /hpf — AB (ref 0–5)

## 2020-06-21 LAB — BLADDER SCAN AMB NON-IMAGING: Scan Result: 0

## 2020-06-21 MED ORDER — ESTRADIOL 0.1 MG/GM VA CREA: TOPICAL_CREAM | VAGINAL | 12 refills | Status: DC

## 2020-07-25 IMAGING — MG MM DIGITAL SCREENING BILAT W/ TOMO W/ CAD
6 of 10 series · 6 of 30 positions shown · non-contrast
Comparison: Previous exam(s).

CLINICAL DATA: Screening.

EXAM:
DIGITAL SCREENING BILATERAL MAMMOGRAM WITH TOMO AND CAD

[R CC synth-2D]
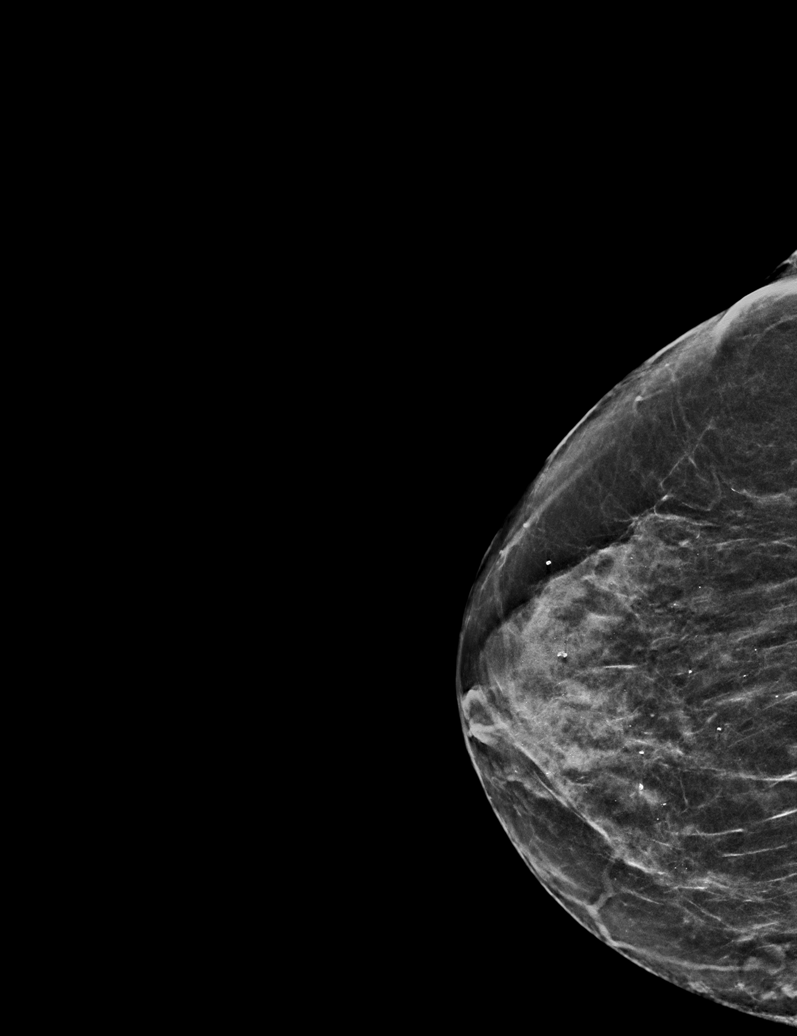

[L MLO synth-2D]
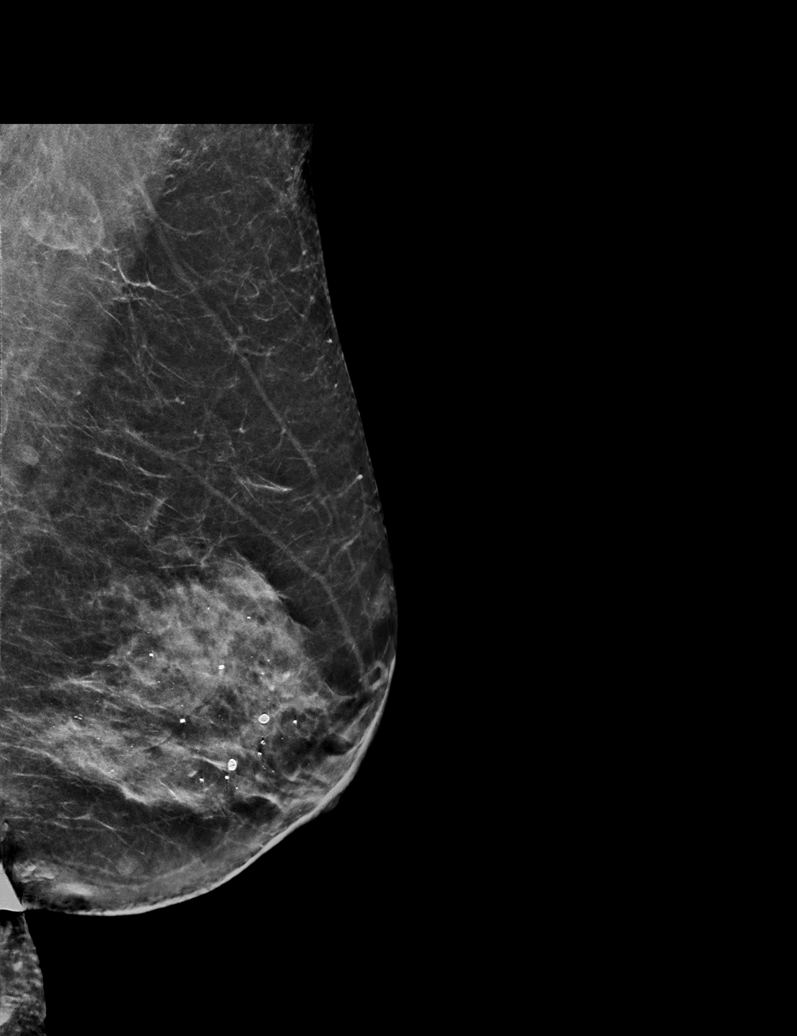

[R MLO synth-2D]
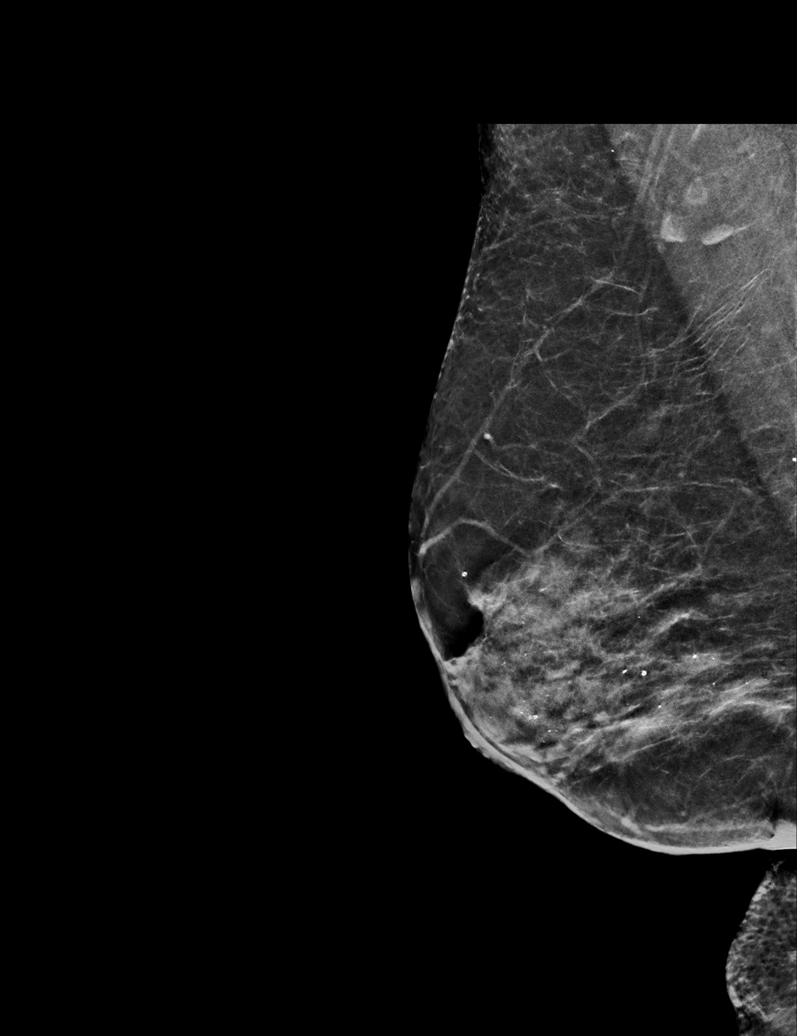

[L CC synth-2D (1 of 2)]
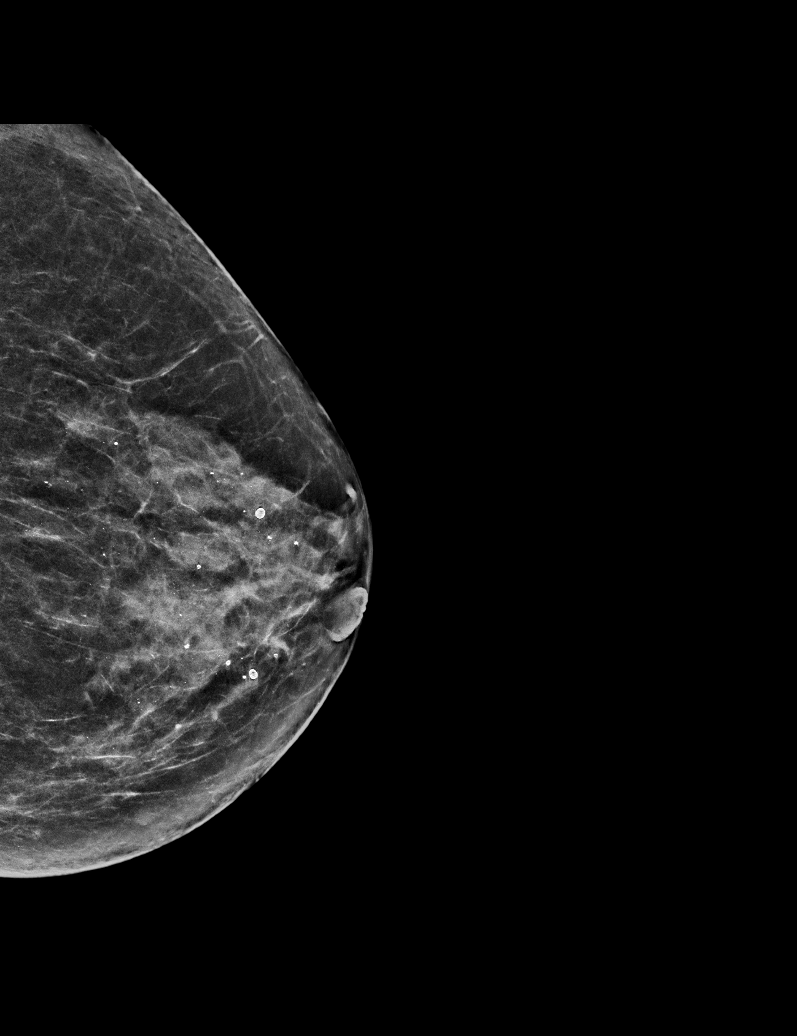

[L CC synth-2D (2 of 2)]
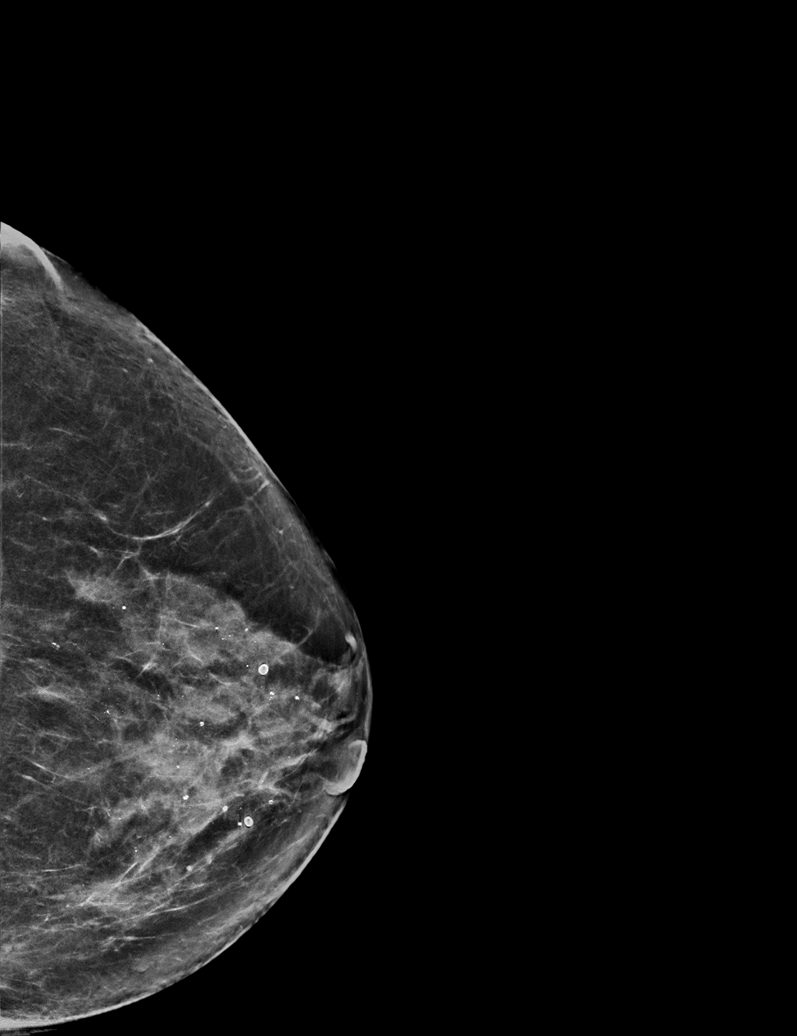

[R MLO tomo · tomo slice 28/55.0]
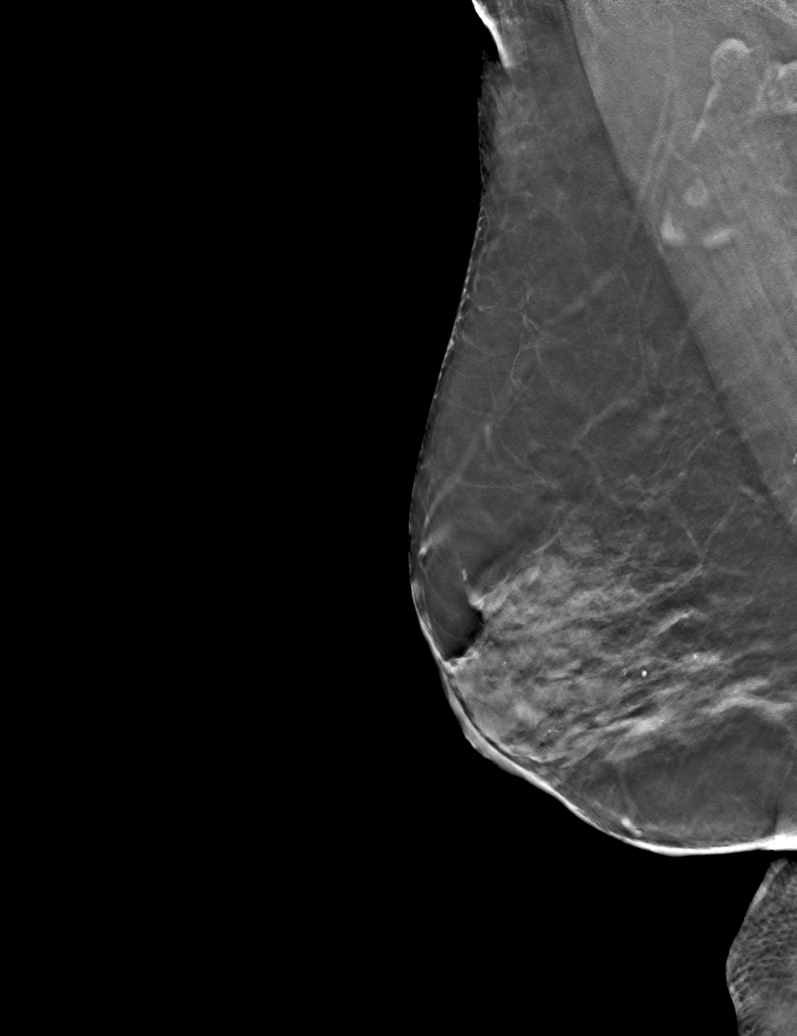

[6 of 30 positions shown; findings below may reference images not displayed]

ACR Breast Density Category c: The breast tissue is heterogeneously
dense, which may obscure small masses.
FINDINGS: There are no findings suspicious for malignancy. Images were
processed with CAD.
IMPRESSION: No mammographic evidence of malignancy. A result letter of this
screening mammogram will be mailed directly to the patient.

RECOMMENDATION:
Screening mammogram in one year. (Code:[id number])

BI-RADS CATEGORY  1: Negative.

## 2020-08-02 DIAGNOSIS — L570 Actinic keratosis: Secondary | ICD-10-CM | POA: Diagnosis not present

## 2020-08-02 DIAGNOSIS — D225 Melanocytic nevi of trunk: Secondary | ICD-10-CM | POA: Diagnosis not present

## 2020-08-02 DIAGNOSIS — X32XXXA Exposure to sunlight, initial encounter: Secondary | ICD-10-CM | POA: Diagnosis not present

## 2020-08-02 DIAGNOSIS — D2261 Melanocytic nevi of right upper limb, including shoulder: Secondary | ICD-10-CM | POA: Diagnosis not present

## 2020-08-02 DIAGNOSIS — D2262 Melanocytic nevi of left upper limb, including shoulder: Secondary | ICD-10-CM | POA: Diagnosis not present

## 2020-08-02 DIAGNOSIS — Z85828 Personal history of other malignant neoplasm of skin: Secondary | ICD-10-CM | POA: Diagnosis not present

## 2020-08-02 DIAGNOSIS — L821 Other seborrheic keratosis: Secondary | ICD-10-CM | POA: Diagnosis not present

## 2020-09-13 DIAGNOSIS — I700 Atherosclerosis of aorta: Secondary | ICD-10-CM | POA: Diagnosis not present

## 2020-09-13 DIAGNOSIS — E7800 Pure hypercholesterolemia, unspecified: Secondary | ICD-10-CM | POA: Diagnosis not present

## 2020-09-20 DIAGNOSIS — I700 Atherosclerosis of aorta: Secondary | ICD-10-CM | POA: Diagnosis not present

## 2020-09-20 DIAGNOSIS — R030 Elevated blood-pressure reading, without diagnosis of hypertension: Secondary | ICD-10-CM | POA: Diagnosis not present

## 2020-09-20 DIAGNOSIS — E7800 Pure hypercholesterolemia, unspecified: Secondary | ICD-10-CM | POA: Diagnosis not present

## 2020-09-27 DIAGNOSIS — Z961 Presence of intraocular lens: Secondary | ICD-10-CM | POA: Diagnosis not present

## 2020-09-27 DIAGNOSIS — H26493 Other secondary cataract, bilateral: Secondary | ICD-10-CM | POA: Diagnosis not present

## 2020-09-27 DIAGNOSIS — H353131 Nonexudative age-related macular degeneration, bilateral, early dry stage: Secondary | ICD-10-CM | POA: Diagnosis not present

## 2021-03-21 ENCOUNTER — Other Ambulatory Visit: Payer: Self-pay | Admitting: Internal Medicine

## 2021-03-21 DIAGNOSIS — E7800 Pure hypercholesterolemia, unspecified: Secondary | ICD-10-CM | POA: Diagnosis not present

## 2021-03-21 DIAGNOSIS — I700 Atherosclerosis of aorta: Secondary | ICD-10-CM | POA: Diagnosis not present

## 2021-03-21 DIAGNOSIS — Z1231 Encounter for screening mammogram for malignant neoplasm of breast: Secondary | ICD-10-CM

## 2021-03-28 DIAGNOSIS — I700 Atherosclerosis of aorta: Secondary | ICD-10-CM | POA: Diagnosis not present

## 2021-03-28 DIAGNOSIS — Z0000 Encounter for general adult medical examination without abnormal findings: Secondary | ICD-10-CM | POA: Diagnosis not present

## 2021-03-28 DIAGNOSIS — E7800 Pure hypercholesterolemia, unspecified: Secondary | ICD-10-CM | POA: Diagnosis not present

## 2021-04-20 ENCOUNTER — Ambulatory Visit
Admission: RE | Admit: 2021-04-20 | Discharge: 2021-04-20 | Disposition: A | Discharge status: Home / Self Care | Payer: PPO | Source: Ambulatory Visit | Attending: Internal Medicine | Admitting: Internal Medicine

## 2021-04-20 ENCOUNTER — Other Ambulatory Visit: Payer: Self-pay

## 2021-04-20 DIAGNOSIS — Z1231 Encounter for screening mammogram for malignant neoplasm of breast: Secondary | ICD-10-CM | POA: Insufficient documentation

## 2021-06-20 NOTE — Progress Notes (Signed)
06/21/2021 6:53 PM   Kelly Rivas [birth year] [id number]  Referring provider: Kirk Ruths, MD [facility] [place],  [place] [address]  Chief Complaint  Patient presents with   Over Active Bladder    Urological history: 1. High risk hematuria -non-smoker -CTU 2018 -stable small renal cortical lesions -cysto 2018 NED -no reports of gross heme  -UA negative for micro heme  2. Vaginal atrophy -s/p hysterectomy -negative mammogram 2022 -managed with vaginal estrogen cream applied three nights weekly   3. Nephrolithiasis -spontaneous passage of stone 2019   4. rUTI's -contributing factors of age, vaginal atrophy and constipation -documented positive urine cultures over the last year  None -managed with vaginal estrogen cream, Vitamin C and cranberry tablet   5. OAB -contributing factors of age, vaginal atrophy, increase water intake and pelvic surgery  HPI: Kelly Rivas is a 80 y.o. female who presents today for one year follow up.   UA > 30 WBC's and many bacteria.   She is experiencing 1-7 daytime voids, 1-2 nighttime voids she has a mild urge to urinate.  She does lose urine when she cannot make it to the restroom on time.  She has leakage about 1-2 times weekly.  She wears a panty liner daily.  She does not engage in toilet mapping.  Patient denies any modifying or aggravating factors.  Patient denies any gross hematuria, dysuria or suprapubic/flank pain.  Patient denies any fevers, chills, nausea or vomiting.    PMH: Past Medical History:  Diagnosis Date   Arthritis    Atrophic vaginitis    Constipation    Dysuria    Flank pain    GERD (gastroesophageal reflux disease)    History of recurrent UTIs    HLD (hyperlipidemia)     Surgical History: Past Surgical History:  Procedure Laterality Date   ABDOMINAL HYSTERECTOMY     BREAST EXCISIONAL BIOPSY Bilateral 1970's   BREAST SURGERY     COLONOSCOPY N/A     COLONOSCOPY WITH PROPOFOL N/A 03/24/2015   Procedure: COLONOSCOPY WITH PROPOFOL;  Surgeon: Hulen Luster, MD;  Location: [facility] ENDOSCOPY;  Service: Gastroenterology;  Laterality: N/A;   MOHS SURGERY      Home Medications:  Allergies as of 06/21/2021       Reactions   Nitrofurantoin         Medication List        Accurate as of June 21, 2021 11:59 PM. If you have any questions, ask your nurse or doctor.          aspirin EC 81 MG tablet Take by mouth.   Calcium Carbonate-Vitamin D 600-400 MG-UNIT tablet Take by mouth.   Cranberry 500 MG Tabs Take by mouth.   diclofenac Sodium 1 % Gel Commonly known as: VOLTAREN Apply topically.   estradiol 0.1 MG/GM vaginal cream Commonly known as: ESTRACE Apply 0.81m (pea-sized amount)  just inside the vaginal introitus with a finger-tip on Monday, Wednesday and Friday nights.   famotidine 20 MG tablet Commonly known as: PEPCID Take by mouth.   Fish Oil 1000 MG Caps Take by mouth.   fluticasone 50 MCG/ACT nasal spray Commonly known as: FLONASE Place into the nose.   Metamucil 48.57 % Powd Generic drug: Psyllium Take by mouth.   omeprazole 20 MG capsule Commonly known as: PRILOSEC Take by mouth.   pravastatin 40 MG tablet Commonly known as: PRAVACHOL TAKE 1 TABLET EVERY DAY   PRESERVISION AREDS PO  Take by mouth.        Allergies:  Allergies  Allergen Reactions   Nitrofurantoin     Family History: Family History  Problem Relation Age of Onset   Diabetes Mellitus II Mother    Breast cancer Sister 35   Kidney disease Neg Hx    Bladder Cancer Neg Hx    Kidney cancer Neg Hx     Social History:  reports that she has never smoked. She has never used smokeless tobacco. She reports that she does not drink alcohol and does not use drugs.  ROS: For pertinent review of systems please refer to history of present illness  Physical Exam: BP (!) 161/91   Pulse 80   Ht _0  (1.575 m)   Wt 140 lb (63.5 kg)    BMI 25.61 kg/m   Constitutional:  Well nourished. Alert and oriented, No acute distress. HEENT: [place] AT, mask in place.  Trachea midline Cardiovascular: No clubbing, cyanosis, or edema. Respiratory: Normal respiratory effort, no increased work of breathing. Neurologic: Grossly intact, no focal deficits, moving all 4 extremities. Psychiatric: Normal mood and affect.    Laboratory Data Cholesterol, Total 100 - 200 mg/dL 171   Triglyceride 35 - 199 mg/dL 104   HDL (High Density Lipoprotein) Cholesterol 35.0 - 85.0 mg/dL 64.8   LDL Calculated 0 - 130 mg/dL 85   VLDL Cholesterol mg/dL 21   Cholesterol/HDL Ratio  2.6   Resulting Agency  [facility] - LAB  Specimen Collected: 03/21/21 07:14 Last Resulted: 03/21/21 09:35  Received From: [facility]  Result Received: 03/21/21 11:04   Glucose 70 - 110 mg/dL 84   Sodium 136 - 145 mmol/L 141   Potassium 3.6 - 5.1 mmol/L 4.6   Chloride 97 - 109 mmol/L 105   Carbon Dioxide (CO2) 22.0 - 32.0 mmol/L 31.1   Urea Nitrogen (BUN) 7 - 25 mg/dL 16   Creatinine 0.6 - 1.1 mg/dL 0.8   Glomerular Filtration Rate (eGFR), MDRD Estimate >60 mL/min/1.73sq m 70   Calcium 8.7 - 10.3 mg/dL 9.3   AST  8 - 39 U/L 19   ALT  5 - 38 U/L 17   Alk Phos (alkaline Phosphatase) 34 - 104 U/L 46   Albumin 3.5 - 4.8 g/dL 4.1   Bilirubin, Total 0.3 - 1.2 mg/dL 0.8   Protein, Total 6.1 - 7.9 g/dL 6.6   A/G Ratio 1.0 - 5.0 gm/dL 1.6   Resulting Agency  [facility] - LAB  Specimen Collected: 03/21/21 07:14 Last Resulted: 03/21/21 09:35  Received From: [facility]  Result Received: 03/21/21 11:04   Urinalysis Component     Latest Ref Rng & Units 06/21/2021  Specific Gravity, UA     1.005 - 1.030 <1.005 (L)  pH, UA     5.0 - 7.5 6.0  Color, UA     Yellow Yellow  Appearance Ur     Clear Cloudy (A)  Leukocytes,UA     Negative 2+ (A)  Protein,UA     Negative/Trace Negative  Glucose, UA     Negative Negative   Ketones, UA     Negative Negative  RBC, UA     Negative Trace (A)  Bilirubin, UA     Negative Negative  Urobilinogen, Ur     0.2 - 1.0 mg/dL 0.2  Nitrite, UA     Negative Negative  Microscopic Examination      See below:   Component  Latest Ref Rng & Units 06/21/2021  WBC, UA     0 - 5 /hpf >30 (H)  RBC     0 - 2 /hpf 0-2  Epithelial Cells (non renal)     0 - 10 /hpf 0-10  Bacteria, UA     None seen/Few Many (A)     I have reviewed the labs.  Pertinent Imaging N/A   Assessment & Plan:    1. Incontinence -managed with panty liner   2. Atrophic vaginitis -continue to apply vaginal estrogen cream Monday, Wednesday and Friday nights-refills given   3. History of hematuria (high risk) -Hematuria work up completed in 10/28 - findings positive for renal cyst -No report of gross hematuria  -UA today is negative for micro heme  4. History of recurrent UTI's -continue cranberry tablets, vitamin C and applying vaginal estrogen cream  5. Nephrolithiasis -No passage of fragments since last visit    Return in about 1 year (around 06/21/2022) for UA and OAB questionnaire.  Zara Council, PA-C  [facility] [address] [place], [place] [address] [phone]

## 2021-06-21 ENCOUNTER — Ambulatory Visit: Payer: PPO | Admitting: Urology

## 2021-06-21 ENCOUNTER — Other Ambulatory Visit: Payer: Self-pay

## 2021-06-21 ENCOUNTER — Encounter: Payer: Self-pay | Admitting: Urology

## 2021-06-21 VITALS — BP 161/91 | HR 80 | Ht 62.0 in | Wt 140.0 lb

## 2021-06-21 DIAGNOSIS — N952 Postmenopausal atrophic vaginitis: Secondary | ICD-10-CM | POA: Diagnosis not present

## 2021-06-21 DIAGNOSIS — N3281 Overactive bladder: Secondary | ICD-10-CM | POA: Diagnosis not present

## 2021-06-21 LAB — URINALYSIS, COMPLETE
Bilirubin, UA: NEGATIVE
Glucose, UA: NEGATIVE
Ketones, UA: NEGATIVE
Nitrite, UA: NEGATIVE
Protein,UA: NEGATIVE
Specific Gravity, UA: <1.005 — ABNORMAL LOW (ref 1.005–1.030)
Urobilinogen, Ur: 0.2 mg/dL (ref 0.2–1.0)
pH, UA: 6 (ref 5.0–7.5)

## 2021-06-21 LAB — MICROSCOPIC EXAMINATION: WBC, UA: >30 /hpf — ABNORMAL HIGH (ref 0–5)

## 2021-08-07 DIAGNOSIS — D485 Neoplasm of uncertain behavior of skin: Secondary | ICD-10-CM | POA: Diagnosis not present

## 2021-08-07 DIAGNOSIS — D2262 Melanocytic nevi of left upper limb, including shoulder: Secondary | ICD-10-CM | POA: Diagnosis not present

## 2021-08-07 DIAGNOSIS — X32XXXA Exposure to sunlight, initial encounter: Secondary | ICD-10-CM | POA: Diagnosis not present

## 2021-08-07 DIAGNOSIS — D2271 Melanocytic nevi of right lower limb, including hip: Secondary | ICD-10-CM | POA: Diagnosis not present

## 2021-08-07 DIAGNOSIS — D2261 Melanocytic nevi of right upper limb, including shoulder: Secondary | ICD-10-CM | POA: Diagnosis not present

## 2021-08-07 DIAGNOSIS — Z85828 Personal history of other malignant neoplasm of skin: Secondary | ICD-10-CM | POA: Diagnosis not present

## 2021-08-07 DIAGNOSIS — L820 Inflamed seborrheic keratosis: Secondary | ICD-10-CM | POA: Diagnosis not present

## 2021-08-07 DIAGNOSIS — L570 Actinic keratosis: Secondary | ICD-10-CM | POA: Diagnosis not present

## 2021-09-20 DIAGNOSIS — E7800 Pure hypercholesterolemia, unspecified: Secondary | ICD-10-CM | POA: Diagnosis not present

## 2021-09-20 DIAGNOSIS — I700 Atherosclerosis of aorta: Secondary | ICD-10-CM | POA: Diagnosis not present

## 2021-09-27 DIAGNOSIS — R030 Elevated blood-pressure reading, without diagnosis of hypertension: Secondary | ICD-10-CM | POA: Diagnosis not present

## 2021-09-27 DIAGNOSIS — K219 Gastro-esophageal reflux disease without esophagitis: Secondary | ICD-10-CM | POA: Diagnosis not present

## 2021-09-27 DIAGNOSIS — M810 Age-related osteoporosis without current pathological fracture: Secondary | ICD-10-CM | POA: Diagnosis not present

## 2021-09-27 DIAGNOSIS — E7800 Pure hypercholesterolemia, unspecified: Secondary | ICD-10-CM | POA: Diagnosis not present

## 2021-09-27 DIAGNOSIS — I700 Atherosclerosis of aorta: Secondary | ICD-10-CM | POA: Diagnosis not present

## 2021-10-24 DIAGNOSIS — M8588 Other specified disorders of bone density and structure, other site: Secondary | ICD-10-CM | POA: Diagnosis not present

## 2021-10-25 ENCOUNTER — Other Ambulatory Visit: Payer: Self-pay | Admitting: Urology

## 2021-10-25 DIAGNOSIS — N952 Postmenopausal atrophic vaginitis: Secondary | ICD-10-CM

## 2021-12-06 DIAGNOSIS — M79672 Pain in left foot: Secondary | ICD-10-CM | POA: Diagnosis not present

## 2021-12-19 DIAGNOSIS — H26493 Other secondary cataract, bilateral: Secondary | ICD-10-CM | POA: Diagnosis not present

## 2021-12-19 DIAGNOSIS — Z961 Presence of intraocular lens: Secondary | ICD-10-CM | POA: Diagnosis not present

## 2021-12-19 DIAGNOSIS — H353131 Nonexudative age-related macular degeneration, bilateral, early dry stage: Secondary | ICD-10-CM | POA: Diagnosis not present

## 2022-03-07 DIAGNOSIS — M79672 Pain in left foot: Secondary | ICD-10-CM | POA: Diagnosis not present

## 2022-03-07 DIAGNOSIS — N939 Abnormal uterine and vaginal bleeding, unspecified: Secondary | ICD-10-CM | POA: Diagnosis not present

## 2022-03-15 DIAGNOSIS — M546 Pain in thoracic spine: Secondary | ICD-10-CM | POA: Diagnosis not present

## 2022-03-15 DIAGNOSIS — M5489 Other dorsalgia: Secondary | ICD-10-CM | POA: Diagnosis not present

## 2022-03-15 DIAGNOSIS — M545 Low back pain, unspecified: Secondary | ICD-10-CM | POA: Diagnosis not present

## 2022-03-21 DIAGNOSIS — J309 Allergic rhinitis, unspecified: Secondary | ICD-10-CM | POA: Diagnosis not present

## 2022-03-21 DIAGNOSIS — M810 Age-related osteoporosis without current pathological fracture: Secondary | ICD-10-CM | POA: Diagnosis not present

## 2022-03-21 DIAGNOSIS — I700 Atherosclerosis of aorta: Secondary | ICD-10-CM | POA: Diagnosis not present

## 2022-03-21 DIAGNOSIS — G8929 Other chronic pain: Secondary | ICD-10-CM | POA: Diagnosis not present

## 2022-03-21 DIAGNOSIS — H3530 Unspecified macular degeneration: Secondary | ICD-10-CM | POA: Diagnosis not present

## 2022-03-21 DIAGNOSIS — M1990 Unspecified osteoarthritis, unspecified site: Secondary | ICD-10-CM | POA: Diagnosis not present

## 2022-03-21 DIAGNOSIS — E785 Hyperlipidemia, unspecified: Secondary | ICD-10-CM | POA: Diagnosis not present

## 2022-03-21 DIAGNOSIS — M419 Scoliosis, unspecified: Secondary | ICD-10-CM | POA: Diagnosis not present

## 2022-03-21 DIAGNOSIS — N952 Postmenopausal atrophic vaginitis: Secondary | ICD-10-CM | POA: Diagnosis not present

## 2022-03-21 DIAGNOSIS — Z7982 Long term (current) use of aspirin: Secondary | ICD-10-CM | POA: Diagnosis not present

## 2022-03-21 DIAGNOSIS — K5900 Constipation, unspecified: Secondary | ICD-10-CM | POA: Diagnosis not present

## 2022-03-21 DIAGNOSIS — K219 Gastro-esophageal reflux disease without esophagitis: Secondary | ICD-10-CM | POA: Diagnosis not present

## 2022-03-27 DIAGNOSIS — M2012 Hallux valgus (acquired), left foot: Secondary | ICD-10-CM | POA: Diagnosis not present

## 2022-03-27 DIAGNOSIS — M79672 Pain in left foot: Secondary | ICD-10-CM | POA: Diagnosis not present

## 2022-03-27 DIAGNOSIS — K219 Gastro-esophageal reflux disease without esophagitis: Secondary | ICD-10-CM | POA: Diagnosis not present

## 2022-03-27 DIAGNOSIS — I700 Atherosclerosis of aorta: Secondary | ICD-10-CM | POA: Diagnosis not present

## 2022-03-27 DIAGNOSIS — M19072 Primary osteoarthritis, left ankle and foot: Secondary | ICD-10-CM | POA: Diagnosis not present

## 2022-04-11 DIAGNOSIS — I700 Atherosclerosis of aorta: Secondary | ICD-10-CM | POA: Diagnosis not present

## 2022-04-11 DIAGNOSIS — R030 Elevated blood-pressure reading, without diagnosis of hypertension: Secondary | ICD-10-CM | POA: Diagnosis not present

## 2022-04-11 DIAGNOSIS — E7800 Pure hypercholesterolemia, unspecified: Secondary | ICD-10-CM | POA: Diagnosis not present

## 2022-04-17 ENCOUNTER — Other Ambulatory Visit: Payer: Self-pay | Admitting: Internal Medicine

## 2022-04-17 DIAGNOSIS — Z1231 Encounter for screening mammogram for malignant neoplasm of breast: Secondary | ICD-10-CM

## 2022-04-18 DIAGNOSIS — Z0000 Encounter for general adult medical examination without abnormal findings: Secondary | ICD-10-CM | POA: Diagnosis not present

## 2022-04-18 DIAGNOSIS — M1612 Unilateral primary osteoarthritis, left hip: Secondary | ICD-10-CM | POA: Diagnosis not present

## 2022-04-18 DIAGNOSIS — R1032 Left lower quadrant pain: Secondary | ICD-10-CM | POA: Diagnosis not present

## 2022-04-18 DIAGNOSIS — M5489 Other dorsalgia: Secondary | ICD-10-CM | POA: Diagnosis not present

## 2022-04-18 DIAGNOSIS — I700 Atherosclerosis of aorta: Secondary | ICD-10-CM | POA: Diagnosis not present

## 2022-04-18 DIAGNOSIS — N939 Abnormal uterine and vaginal bleeding, unspecified: Secondary | ICD-10-CM | POA: Diagnosis not present

## 2022-04-18 DIAGNOSIS — R030 Elevated blood-pressure reading, without diagnosis of hypertension: Secondary | ICD-10-CM | POA: Diagnosis not present

## 2022-04-18 DIAGNOSIS — R1030 Lower abdominal pain, unspecified: Secondary | ICD-10-CM | POA: Diagnosis not present

## 2022-04-20 DIAGNOSIS — R102 Pelvic and perineal pain: Secondary | ICD-10-CM | POA: Diagnosis not present

## 2022-04-20 DIAGNOSIS — N950 Postmenopausal bleeding: Secondary | ICD-10-CM | POA: Diagnosis not present

## 2022-04-20 DIAGNOSIS — Z90710 Acquired absence of both cervix and uterus: Secondary | ICD-10-CM | POA: Diagnosis not present

## 2022-05-08 ENCOUNTER — Ambulatory Visit
Admission: RE | Admit: 2022-05-08 | Discharge: 2022-05-08 | Disposition: A | Discharge status: Home / Self Care | Payer: PPO | Source: Ambulatory Visit | Attending: Internal Medicine | Admitting: Internal Medicine

## 2022-05-08 DIAGNOSIS — Z1231 Encounter for screening mammogram for malignant neoplasm of breast: Secondary | ICD-10-CM | POA: Insufficient documentation

## 2022-05-10 DIAGNOSIS — M5442 Lumbago with sciatica, left side: Secondary | ICD-10-CM | POA: Diagnosis not present

## 2022-05-10 DIAGNOSIS — G8929 Other chronic pain: Secondary | ICD-10-CM | POA: Diagnosis not present

## 2022-05-10 DIAGNOSIS — M5441 Lumbago with sciatica, right side: Secondary | ICD-10-CM | POA: Diagnosis not present

## 2022-05-15 DIAGNOSIS — R102 Pelvic and perineal pain: Secondary | ICD-10-CM | POA: Diagnosis not present

## 2022-05-15 DIAGNOSIS — Z90710 Acquired absence of both cervix and uterus: Secondary | ICD-10-CM | POA: Diagnosis not present

## 2022-05-23 DIAGNOSIS — G8929 Other chronic pain: Secondary | ICD-10-CM | POA: Diagnosis not present

## 2022-05-23 DIAGNOSIS — M5442 Lumbago with sciatica, left side: Secondary | ICD-10-CM | POA: Diagnosis not present

## 2022-05-23 DIAGNOSIS — M5441 Lumbago with sciatica, right side: Secondary | ICD-10-CM | POA: Diagnosis not present

## 2022-05-30 DIAGNOSIS — Z23 Encounter for immunization: Secondary | ICD-10-CM | POA: Diagnosis not present

## 2022-06-20 NOTE — Progress Notes (Unsigned)
06/21/2022 9:55 AM   Kelly Rivas April 06, 1941 [id number]  Referring provider: Kirk Ruths, MD [facility] [place],  [place] [address]  Urological history: 1. High risk hematuria -non-smoker -CTU 2018 -stable small renal cortical lesions -cysto 2018 NED -reports of gross heme in April  -UA (02/2022) negative for micro heme  2. Vaginal atrophy -s/p hysterectomy -negative mammogram 2023 -managed with vaginal estrogen cream applied three nights weekly   3. Nephrolithiasis -spontaneous passage of stone 2019   4. rUTI's -contributing factors of age, vaginal atrophy and constipation -documented positive urine cultures over the last year  None -vaginal estrogen cream, Vitamin C and cranberry tablet   5. OAB -contributing factors of age, vaginal atrophy, increase water intake and pelvic surgery  Chief Complaint  Patient presents with   Over Active Bladder     HPI: Kelly Rivas is a 81 y.o. female who presents today for one year follow up.   She had one episode of painless gross heme in April.  She had been seen and worked up by gynecology and they did not determine a cause.    On OAB questionnaire, she has had 1-7 daytime urinations, 1-2 nighttime urinations with no urinary leakage.  She does wear a panty liner daily for protection.  Patient denies any modifying or aggravating factors.  Patient denies any gross hematuria, dysuria or suprapubic/flank pain.  Patient denies any fevers, chills, nausea or vomiting.     PMH: Past Medical History:  Diagnosis Date   Arthritis    Atrophic vaginitis    Constipation    Dysuria    Flank pain    GERD (gastroesophageal reflux disease)    History of recurrent UTIs    HLD (hyperlipidemia)     Surgical History: Past Surgical History:  Procedure Laterality Date   ABDOMINAL HYSTERECTOMY     BREAST EXCISIONAL BIOPSY Bilateral 1970's   BREAST SURGERY     COLONOSCOPY N/A     COLONOSCOPY WITH PROPOFOL N/A 03/24/2015   Procedure: COLONOSCOPY WITH PROPOFOL;  Surgeon: Hulen Luster, MD;  Location: [facility] ENDOSCOPY;  Service: Gastroenterology;  Laterality: N/A;   MOHS SURGERY      Home Medications:  Allergies as of 06/21/2022       Reactions   Nitrofurantoin         Medication List        Accurate as of June 21, 2022  9:55 AM. If you have any questions, ask your nurse or doctor.          aspirin EC 81 MG tablet Take by mouth.   Calcium Carbonate-Vitamin D 600-400 MG-UNIT tablet Take by mouth.   Cranberry 500 MG Tabs Take by mouth.   diclofenac Sodium 1 % Gel Commonly known as: VOLTAREN Apply topically.   estradiol 0.1 MG/GM vaginal cream Commonly known as: ESTRACE APPLY 0.$RemoveBeforeD'5MG'JMsdyfgEczsPiU$  (PEA-SIZED AMOUNT) JUST INSIDE THE VAGINAL INTROITUS WITH FINGER-TIP ON MONDAY, WEDNESDAY, AND FRIDAY NIGHTS   famotidine 20 MG tablet Commonly known as: PEPCID Take by mouth.   Fish Oil 1000 MG Caps Take by mouth.   fluticasone 50 MCG/ACT nasal spray Commonly known as: FLONASE Place into the nose.   Metamucil 48.57 % Powd Generic drug: Psyllium Take by mouth.   omeprazole 20 MG capsule Commonly known as: PRILOSEC Take by mouth.   pravastatin 40 MG tablet Commonly known as: PRAVACHOL TAKE 1 TABLET EVERY DAY   PRESERVISION AREDS PO Take by mouth.  Allergies:  Allergies  Allergen Reactions   Nitrofurantoin     Family History: Family History  Problem Relation Age of Onset   Diabetes Mellitus II Mother    Breast cancer Sister 35   Kidney disease Neg Hx    Bladder Cancer Neg Hx    Kidney cancer Neg Hx     Social History:  reports that she has never smoked. She has never used smokeless tobacco. She reports that she does not drink alcohol and does not use drugs.  ROS: For pertinent review of systems please refer to history of present illness  Physical Exam: BP 128/85   Pulse 83   Ht $R'5\' 2"'Qk$  (1.575 m)   Wt 136 lb (61.7 kg)   BMI  24.87 kg/m   Constitutional:  Well nourished. Alert and oriented, No acute distress. HEENT: [place] AT, moist mucus membranes.  Trachea midline Cardiovascular: No clubbing, cyanosis, or edema. Respiratory: Normal respiratory effort, no increased work of breathing. Neurologic: Grossly intact, no focal deficits, moving all 4 extremities. Psychiatric: Normal mood and affect.    Laboratory Data Glucose 70 - 110 mg/dL 82   Sodium 136 - 145 mmol/L 140   Potassium 3.6 - 5.1 mmol/L 4.2   Chloride 97 - 109 mmol/L 103   Carbon Dioxide (CO2) 22.0 - 32.0 mmol/L 30.4   Urea Nitrogen (BUN) 7 - 25 mg/dL 13   Creatinine 0.6 - 1.1 mg/dL 0.7   Glomerular Filtration Rate (eGFR), MDRD Estimate >60 mL/min/1.73sq m 81   Calcium 8.7 - 10.3 mg/dL 9.1   AST  8 - 39 U/L 21   ALT  5 - 38 U/L 19   Alk Phos (alkaline Phosphatase) 34 - 104 U/L 49   Albumin 3.5 - 4.8 g/dL 4.1   Bilirubin, Total 0.3 - 1.2 mg/dL 0.7   Protein, Total 6.1 - 7.9 g/dL 6.5   A/G Ratio 1.0 - 5.0 gm/dL 1.7   Resulting Agency  [facility]   Specimen Collected: 04/11/22 07:13   Performed by: [facility] Last Resulted: 04/11/22 10:51  Received From: [facility]  Result Received: 04/17/22 10:09   Color Yellow, Violet, Light Violet, Dark Violet Yellow   Clarity Clear, Other SL Cloudy Abnormal    Specific Gravity 1.000 - 1.030 <=1.005   pH, Urine 5.0 - 8.0 6.5   Protein, Urinalysis Negative, Trace mg/dL Negative   Glucose, Urinalysis Negative mg/dL Negative   Ketones, Urinalysis Negative mg/dL Negative   Blood, Urinalysis Negative Negative   Nitrite, Urinalysis Negative Negative   Leukocyte Esterase, Urinalysis Negative Moderate Abnormal    White Blood Cells, Urinalysis None Seen, 0-3 /hpf 10-50 Abnormal    Red Blood Cells, Urinalysis None Seen, 0-3 /hpf None Seen   Bacteria, Urinalysis None Seen /hpf Moderate Abnormal    Squamous Epithelial Cells, Urinalysis Rare, Few, None Seen /hpf Rare    Resulting Agency  [facility] - LAB   Specimen Collected: 03/07/22 10:01   Performed by: [facility] - LAB Last Resulted: 03/07/22 16:10  Received From: [facility]  Result Received: 04/17/22 10:09  I have reviewed the labs.  Pertinent Imaging N/A   Assessment & Plan:    1. Incontinence -managed with panty liner   2. Atrophic vaginitis -continue to apply vaginal estrogen cream Monday, Wednesday and Friday nights-refills given   3. High risk hematuria  -non-smoker -Hematuria work up completed in 10/18 - findings positive for renal cyst -report of  gross hematuria in April -UA (02/2022) negative for micro heme -Discussed with her that her last work up for hematuria was in 2018 since and it is advised to repeat the work-up again at this time and she is in agreement -explained that the CT will be performed with contrast injected into her veins, she denies any allergies to iodine or contrast -Explained that the cystoscopy consists of taking the camera up through the urethra into the bladder and during the procedure she will feel some pressure and afterwards she may have some slight dysuria and even some gross hematuria which will resolve quickly  4. rUTI's -continue cranberry tablets, vitamin C and applying vaginal estrogen cream  5. Nephrolithiasis -No passage of fragments since last visit   Return for CT Urogram report and cystoscopy for gross heme .  [place], [place] [address] [place], [place] [address] [phone]

## 2022-06-21 ENCOUNTER — Encounter: Payer: Self-pay | Admitting: Urology

## 2022-06-21 ENCOUNTER — Ambulatory Visit: Payer: PPO | Admitting: Urology

## 2022-06-21 VITALS — BP 128/85 | HR 83 | Ht 62.0 in | Wt 136.0 lb

## 2022-06-21 DIAGNOSIS — N3281 Overactive bladder: Secondary | ICD-10-CM

## 2022-06-21 DIAGNOSIS — N952 Postmenopausal atrophic vaginitis: Secondary | ICD-10-CM | POA: Diagnosis not present

## 2022-06-21 DIAGNOSIS — R310 Gross hematuria: Secondary | ICD-10-CM

## 2022-06-21 DIAGNOSIS — R319 Hematuria, unspecified: Secondary | ICD-10-CM

## 2022-06-21 MED ORDER — ESTRADIOL 0.1 MG/GM VA CREA: TOPICAL_CREAM | VAGINAL | 0 refills | Status: DC

## 2022-06-27 DIAGNOSIS — I700 Atherosclerosis of aorta: Secondary | ICD-10-CM | POA: Diagnosis not present

## 2022-06-27 DIAGNOSIS — M549 Dorsalgia, unspecified: Secondary | ICD-10-CM | POA: Diagnosis not present

## 2022-06-27 DIAGNOSIS — M25552 Pain in left hip: Secondary | ICD-10-CM | POA: Diagnosis not present

## 2022-06-27 DIAGNOSIS — R030 Elevated blood-pressure reading, without diagnosis of hypertension: Secondary | ICD-10-CM | POA: Diagnosis not present

## 2022-06-27 DIAGNOSIS — M810 Age-related osteoporosis without current pathological fracture: Secondary | ICD-10-CM | POA: Diagnosis not present

## 2022-06-27 DIAGNOSIS — E7800 Pure hypercholesterolemia, unspecified: Secondary | ICD-10-CM | POA: Diagnosis not present

## 2022-07-03 DIAGNOSIS — M5136 Other intervertebral disc degeneration, lumbar region: Secondary | ICD-10-CM | POA: Diagnosis not present

## 2022-07-03 DIAGNOSIS — M1612 Unilateral primary osteoarthritis, left hip: Secondary | ICD-10-CM | POA: Diagnosis not present

## 2022-07-03 DIAGNOSIS — M4125 Other idiopathic scoliosis, thoracolumbar region: Secondary | ICD-10-CM | POA: Diagnosis not present

## 2022-07-06 ENCOUNTER — Ambulatory Visit
Admission: RE | Admit: 2022-07-06 | Discharge: 2022-07-06 | Disposition: A | Discharge status: Home / Self Care | Payer: PPO | Source: Ambulatory Visit | Attending: Urology | Admitting: Urology

## 2022-07-06 DIAGNOSIS — R310 Gross hematuria: Secondary | ICD-10-CM | POA: Insufficient documentation

## 2022-07-06 DIAGNOSIS — N2889 Other specified disorders of kidney and ureter: Secondary | ICD-10-CM | POA: Diagnosis not present

## 2022-07-06 LAB — POCT I-STAT CREATININE: Creatinine, Ser: 0.8 mg/dL (ref 0.44–1.00)

## 2022-07-06 MED ORDER — IOHEXOL 300 MG/ML  SOLN
100.0000 mL | Freq: Once | INTRAMUSCULAR | Status: AC | PRN
  Administered 2022-07-06: 100 mL via INTRAVENOUS

## 2022-07-18 ENCOUNTER — Ambulatory Visit: Payer: PPO | Admitting: Urology

## 2022-07-18 ENCOUNTER — Encounter: Payer: Self-pay | Admitting: Urology

## 2022-07-18 VITALS — BP 153/86 | HR 82 | Ht 62.0 in | Wt 136.0 lb

## 2022-07-18 DIAGNOSIS — R310 Gross hematuria: Secondary | ICD-10-CM

## 2022-07-18 LAB — URINALYSIS, COMPLETE
Bilirubin, UA: NEGATIVE
Glucose, UA: NEGATIVE
Ketones, UA: NEGATIVE
Nitrite, UA: NEGATIVE
Protein,UA: NEGATIVE
Specific Gravity, UA: <1.005 — ABNORMAL LOW (ref 1.005–1.030)
Urobilinogen, Ur: 0.2 mg/dL (ref 0.2–1.0)
pH, UA: 6 (ref 5.0–7.5)

## 2022-07-18 LAB — MICROSCOPIC EXAMINATION

## 2022-07-18 NOTE — Progress Notes (Signed)
   07/18/22  CC:  Chief Complaint  Patient presents with   Cysto    HPI: 81 year old female with a personal history of high risk hematuria status post negative workup in 2018 who presents today for repeat workup.  Most recent imaging in the form of CT urogram on 07/06/2022 shows left interpolar parenchymal calcification, unchanged from 5 years ago.  There is no other GU pathology appreciated.  NED. A&Ox3.   No respiratory distress   Abd soft, NT, ND Normal external genitalia with patent urethral meatus  Cystoscopy Procedure Note  Patient identification was confirmed, informed consent was obtained, and patient was prepped using Betadine solution.  Lidocaine jelly was administered per urethral meatus.    Procedure: - Flexible cystoscope introduced, without any difficulty.   - Thorough search of the bladder revealed:    normal urethral meatus    normal urothelium    no stones    no ulcers     no tumors    no urethral polyps    no trabeculation  - Ureteral orifices were normal in position and appearance.  Post-Procedure: - Patient tolerated the procedure well  Assessment/ Plan:  1. Gross hematuria Status post complete reevaluation with CT urogram and cystoscopy both of which were negative  Plan to have her follow-up again in a year with a repeat UA to continue to monitor and reassess.  If she does develop interval gross hematuria between now and then, we will have her return sooner.  - Urinalysis, Complete     Return in about 1 year (around 07/19/2023) for UA/ symptom recheck.  Hollice Espy, MD

## 2022-08-02 DIAGNOSIS — G8929 Other chronic pain: Secondary | ICD-10-CM | POA: Diagnosis not present

## 2022-08-02 DIAGNOSIS — M4125 Other idiopathic scoliosis, thoracolumbar region: Secondary | ICD-10-CM | POA: Diagnosis not present

## 2022-08-02 DIAGNOSIS — M533 Sacrococcygeal disorders, not elsewhere classified: Secondary | ICD-10-CM | POA: Diagnosis not present

## 2022-08-02 DIAGNOSIS — M25552 Pain in left hip: Secondary | ICD-10-CM | POA: Diagnosis not present

## 2022-08-02 DIAGNOSIS — M1612 Unilateral primary osteoarthritis, left hip: Secondary | ICD-10-CM | POA: Diagnosis not present

## 2022-08-02 DIAGNOSIS — M5136 Other intervertebral disc degeneration, lumbar region: Secondary | ICD-10-CM | POA: Diagnosis not present

## 2022-11-30 DIAGNOSIS — M533 Sacrococcygeal disorders, not elsewhere classified: Secondary | ICD-10-CM | POA: Diagnosis not present

## 2022-11-30 DIAGNOSIS — G8929 Other chronic pain: Secondary | ICD-10-CM | POA: Diagnosis not present

## 2022-11-30 DIAGNOSIS — M1612 Unilateral primary osteoarthritis, left hip: Secondary | ICD-10-CM | POA: Diagnosis not present

## 2022-11-30 DIAGNOSIS — R030 Elevated blood-pressure reading, without diagnosis of hypertension: Secondary | ICD-10-CM | POA: Diagnosis not present

## 2022-11-30 DIAGNOSIS — M5136 Other intervertebral disc degeneration, lumbar region: Secondary | ICD-10-CM | POA: Diagnosis not present

## 2022-12-07 DIAGNOSIS — G8929 Other chronic pain: Secondary | ICD-10-CM | POA: Diagnosis not present

## 2022-12-07 DIAGNOSIS — M533 Sacrococcygeal disorders, not elsewhere classified: Secondary | ICD-10-CM | POA: Diagnosis not present

## 2022-12-07 DIAGNOSIS — M4125 Other idiopathic scoliosis, thoracolumbar region: Secondary | ICD-10-CM | POA: Diagnosis not present

## 2022-12-07 DIAGNOSIS — M1612 Unilateral primary osteoarthritis, left hip: Secondary | ICD-10-CM | POA: Diagnosis not present

## 2022-12-07 DIAGNOSIS — M5136 Other intervertebral disc degeneration, lumbar region: Secondary | ICD-10-CM | POA: Diagnosis not present

## 2022-12-17 DIAGNOSIS — R399 Unspecified symptoms and signs involving the genitourinary system: Secondary | ICD-10-CM | POA: Diagnosis not present

## 2022-12-17 DIAGNOSIS — R350 Frequency of micturition: Secondary | ICD-10-CM | POA: Diagnosis not present

## 2022-12-18 DIAGNOSIS — C44629 Squamous cell carcinoma of skin of left upper limb, including shoulder: Secondary | ICD-10-CM | POA: Diagnosis not present

## 2022-12-18 DIAGNOSIS — D2262 Melanocytic nevi of left upper limb, including shoulder: Secondary | ICD-10-CM | POA: Diagnosis not present

## 2022-12-18 DIAGNOSIS — L570 Actinic keratosis: Secondary | ICD-10-CM | POA: Diagnosis not present

## 2022-12-18 DIAGNOSIS — D2271 Melanocytic nevi of right lower limb, including hip: Secondary | ICD-10-CM | POA: Diagnosis not present

## 2022-12-18 DIAGNOSIS — D485 Neoplasm of uncertain behavior of skin: Secondary | ICD-10-CM | POA: Diagnosis not present

## 2022-12-18 DIAGNOSIS — X32XXXA Exposure to sunlight, initial encounter: Secondary | ICD-10-CM | POA: Diagnosis not present

## 2022-12-18 DIAGNOSIS — D2261 Melanocytic nevi of right upper limb, including shoulder: Secondary | ICD-10-CM | POA: Diagnosis not present

## 2022-12-18 DIAGNOSIS — Z85828 Personal history of other malignant neoplasm of skin: Secondary | ICD-10-CM | POA: Diagnosis not present

## 2022-12-25 DIAGNOSIS — E7800 Pure hypercholesterolemia, unspecified: Secondary | ICD-10-CM | POA: Diagnosis not present

## 2022-12-25 DIAGNOSIS — H353131 Nonexudative age-related macular degeneration, bilateral, early dry stage: Secondary | ICD-10-CM | POA: Diagnosis not present

## 2022-12-25 DIAGNOSIS — R030 Elevated blood-pressure reading, without diagnosis of hypertension: Secondary | ICD-10-CM | POA: Diagnosis not present

## 2022-12-25 DIAGNOSIS — I700 Atherosclerosis of aorta: Secondary | ICD-10-CM | POA: Diagnosis not present

## 2022-12-25 DIAGNOSIS — R7309 Other abnormal glucose: Secondary | ICD-10-CM | POA: Diagnosis not present

## 2022-12-25 DIAGNOSIS — Z961 Presence of intraocular lens: Secondary | ICD-10-CM | POA: Diagnosis not present

## 2022-12-25 DIAGNOSIS — H26493 Other secondary cataract, bilateral: Secondary | ICD-10-CM | POA: Diagnosis not present

## 2023-01-01 DIAGNOSIS — I700 Atherosclerosis of aorta: Secondary | ICD-10-CM | POA: Diagnosis not present

## 2023-01-01 DIAGNOSIS — R030 Elevated blood-pressure reading, without diagnosis of hypertension: Secondary | ICD-10-CM | POA: Diagnosis not present

## 2023-01-01 DIAGNOSIS — E7800 Pure hypercholesterolemia, unspecified: Secondary | ICD-10-CM | POA: Diagnosis not present

## 2023-01-01 DIAGNOSIS — R7303 Prediabetes: Secondary | ICD-10-CM | POA: Diagnosis not present

## 2023-01-24 DIAGNOSIS — D2362 Other benign neoplasm of skin of left upper limb, including shoulder: Secondary | ICD-10-CM | POA: Diagnosis not present

## 2023-01-24 DIAGNOSIS — C44629 Squamous cell carcinoma of skin of left upper limb, including shoulder: Secondary | ICD-10-CM | POA: Diagnosis not present

## 2023-02-14 DIAGNOSIS — R8290 Unspecified abnormal findings in urine: Secondary | ICD-10-CM | POA: Diagnosis not present

## 2023-02-14 DIAGNOSIS — R350 Frequency of micturition: Secondary | ICD-10-CM | POA: Diagnosis not present

## 2023-02-14 DIAGNOSIS — R309 Painful micturition, unspecified: Secondary | ICD-10-CM | POA: Diagnosis not present

## 2023-04-08 ENCOUNTER — Other Ambulatory Visit: Payer: Self-pay | Admitting: Internal Medicine

## 2023-04-08 DIAGNOSIS — Z1231 Encounter for screening mammogram for malignant neoplasm of breast: Secondary | ICD-10-CM

## 2023-04-23 DIAGNOSIS — R300 Dysuria: Secondary | ICD-10-CM | POA: Diagnosis not present

## 2023-04-23 DIAGNOSIS — R8290 Unspecified abnormal findings in urine: Secondary | ICD-10-CM | POA: Diagnosis not present

## 2023-05-06 DIAGNOSIS — M7582 Other shoulder lesions, left shoulder: Secondary | ICD-10-CM | POA: Diagnosis not present

## 2023-05-06 DIAGNOSIS — M25512 Pain in left shoulder: Secondary | ICD-10-CM | POA: Diagnosis not present

## 2023-05-14 ENCOUNTER — Ambulatory Visit
Admission: RE | Admit: 2023-05-14 | Discharge: 2023-05-14 | Disposition: A | Discharge status: Home / Self Care | Payer: PPO | Source: Ambulatory Visit | Attending: Internal Medicine | Admitting: Internal Medicine

## 2023-05-14 DIAGNOSIS — Z1231 Encounter for screening mammogram for malignant neoplasm of breast: Secondary | ICD-10-CM | POA: Insufficient documentation

## 2023-06-04 DIAGNOSIS — M25512 Pain in left shoulder: Secondary | ICD-10-CM | POA: Diagnosis not present

## 2023-06-11 DIAGNOSIS — Z23 Encounter for immunization: Secondary | ICD-10-CM | POA: Diagnosis not present

## 2023-06-12 DIAGNOSIS — M25512 Pain in left shoulder: Secondary | ICD-10-CM | POA: Diagnosis not present

## 2023-06-17 ENCOUNTER — Other Ambulatory Visit: Payer: Self-pay | Admitting: Urology

## 2023-06-17 DIAGNOSIS — N952 Postmenopausal atrophic vaginitis: Secondary | ICD-10-CM

## 2023-06-18 DIAGNOSIS — M25512 Pain in left shoulder: Secondary | ICD-10-CM | POA: Diagnosis not present

## 2023-06-18 DIAGNOSIS — M7582 Other shoulder lesions, left shoulder: Secondary | ICD-10-CM | POA: Diagnosis not present

## 2023-06-19 DIAGNOSIS — M25512 Pain in left shoulder: Secondary | ICD-10-CM | POA: Diagnosis not present

## 2023-06-26 DIAGNOSIS — M25512 Pain in left shoulder: Secondary | ICD-10-CM | POA: Diagnosis not present

## 2023-07-02 DIAGNOSIS — M25512 Pain in left shoulder: Secondary | ICD-10-CM | POA: Diagnosis not present

## 2023-07-03 DIAGNOSIS — E7800 Pure hypercholesterolemia, unspecified: Secondary | ICD-10-CM | POA: Diagnosis not present

## 2023-07-03 DIAGNOSIS — R7303 Prediabetes: Secondary | ICD-10-CM | POA: Diagnosis not present

## 2023-07-10 DIAGNOSIS — Z0000 Encounter for general adult medical examination without abnormal findings: Secondary | ICD-10-CM | POA: Diagnosis not present

## 2023-07-10 DIAGNOSIS — E7800 Pure hypercholesterolemia, unspecified: Secondary | ICD-10-CM | POA: Diagnosis not present

## 2023-07-10 DIAGNOSIS — R030 Elevated blood-pressure reading, without diagnosis of hypertension: Secondary | ICD-10-CM | POA: Diagnosis not present

## 2023-07-10 DIAGNOSIS — R7303 Prediabetes: Secondary | ICD-10-CM | POA: Diagnosis not present

## 2023-07-10 DIAGNOSIS — I10 Essential (primary) hypertension: Secondary | ICD-10-CM | POA: Diagnosis not present

## 2023-07-10 DIAGNOSIS — I700 Atherosclerosis of aorta: Secondary | ICD-10-CM | POA: Diagnosis not present

## 2023-07-15 DIAGNOSIS — D2261 Melanocytic nevi of right upper limb, including shoulder: Secondary | ICD-10-CM | POA: Diagnosis not present

## 2023-07-15 DIAGNOSIS — Z85828 Personal history of other malignant neoplasm of skin: Secondary | ICD-10-CM | POA: Diagnosis not present

## 2023-07-15 DIAGNOSIS — D2272 Melanocytic nevi of left lower limb, including hip: Secondary | ICD-10-CM | POA: Diagnosis not present

## 2023-07-15 DIAGNOSIS — L570 Actinic keratosis: Secondary | ICD-10-CM | POA: Diagnosis not present

## 2023-07-15 DIAGNOSIS — D2262 Melanocytic nevi of left upper limb, including shoulder: Secondary | ICD-10-CM | POA: Diagnosis not present

## 2023-07-15 DIAGNOSIS — D225 Melanocytic nevi of trunk: Secondary | ICD-10-CM | POA: Diagnosis not present

## 2023-07-17 ENCOUNTER — Ambulatory Visit: Payer: PPO | Admitting: Urology

## 2023-07-17 VITALS — BP 158/76 | HR 75 | Ht 62.0 in | Wt 139.4 lb

## 2023-07-17 DIAGNOSIS — Z87440 Personal history of urinary (tract) infections: Secondary | ICD-10-CM | POA: Diagnosis not present

## 2023-07-17 DIAGNOSIS — Z87898 Personal history of other specified conditions: Secondary | ICD-10-CM | POA: Diagnosis not present

## 2023-07-17 DIAGNOSIS — R102 Pelvic and perineal pain: Secondary | ICD-10-CM

## 2023-07-17 DIAGNOSIS — N390 Urinary tract infection, site not specified: Secondary | ICD-10-CM

## 2023-07-17 DIAGNOSIS — N3281 Overactive bladder: Secondary | ICD-10-CM

## 2023-07-17 DIAGNOSIS — N3001 Acute cystitis with hematuria: Secondary | ICD-10-CM | POA: Diagnosis not present

## 2023-07-17 DIAGNOSIS — Z87448 Personal history of other diseases of urinary system: Secondary | ICD-10-CM

## 2023-07-17 LAB — URINALYSIS, COMPLETE
Bilirubin, UA: NEGATIVE
Glucose, UA: NEGATIVE
Ketones, UA: NEGATIVE
Nitrite, UA: NEGATIVE
Protein,UA: NEGATIVE
Specific Gravity, UA: 1.015 (ref 1.005–1.030)
Urobilinogen, Ur: 0.2 mg/dL (ref 0.2–1.0)
pH, UA: 7 (ref 5.0–7.5)

## 2023-07-17 LAB — MICROSCOPIC EXAMINATION

## 2023-07-17 MED ORDER — SULFAMETHOXAZOLE-TRIMETHOPRIM 800-160 MG PO TABS
1.0000 | ORAL_TABLET | Freq: Two times a day (BID) | ORAL | 0 refills | Status: DC
Start: 2023-07-17 — End: 2024-07-14

## 2023-07-17 NOTE — Progress Notes (Signed)
Kelly Rivas,acting as a scribe for Kelly Scotland, MD.,have documented all relevant documentation on the behalf of Kelly Scotland, MD,as directed by  Kelly Scotland, MD while in the presence of Kelly Scotland, MD.  07/17/2023 9:44 AM   Kelly Rivas May 12, 1941 [id number]  Referring provider: Lauro Regulus, MD [address] [facility] [place],  [place] [address]  Chief Complaint  Patient presents with   Follow-up   Hematuria    HPI: 82 year-old female who presents today for annual follow up.   She was last seen a year ago for cystoscopy for high risk hematuria. She had a CT urogram that showed a left interpolar parenchymal calcification that has been unchanged. Her cystoscopy was unremarkable.  Her urinalysis today shows 3-10 RBC and 1-3 WBC.   She also has a personal history of vaginal atrophy, managed on topical estrogen cream.   She also has a remote history of kidney stones and OAB. She is not on any medications for this however.   Today, she reports having three UTIs over the past year, approximately two months apart, with symptoms including low pelvic pain and back pain. She denies burning during urination but notes a decrease in urine output. She has been treated with Bactrim for seven days for previous UTIs, which resolved the symptoms.  Reviewed urine culture data does indicate that she had 2 E. coli urinary tract infections.  Her urine culture from June 2024 was did not grow any bacteria but her associated urinalysis was abnormal.  She reports today that she is worried that she has an acute infection.  She started hurting 2 days ago in the same similar low pelvic pain that she has had with previous infections.  She ended up taking an Azo at that time.  Her symptoms improved somewhat yesterday but have not completely resolved.  She denies any fevers or chills.   PMH: Past Medical History:  Diagnosis Date   Arthritis    Atrophic vaginitis     Constipation    Dysuria    Flank pain    GERD (gastroesophageal reflux disease)    History of recurrent UTIs    HLD (hyperlipidemia)     Surgical History: Past Surgical History:  Procedure Laterality Date   ABDOMINAL HYSTERECTOMY     BREAST EXCISIONAL BIOPSY Bilateral 1970's   BREAST SURGERY     COLONOSCOPY N/A    COLONOSCOPY WITH PROPOFOL N/A 03/24/2015   Procedure: COLONOSCOPY WITH PROPOFOL;  Surgeon: Wallace Cullens, MD;  Location: [facility] ENDOSCOPY;  Service: Gastroenterology;  Laterality: N/A;   MOHS SURGERY      Home Medications:  Allergies as of 07/17/2023       Reactions   Nitrofurantoin         Medication List        Accurate as of July 17, 2023  9:44 AM. If you have any questions, ask your nurse or doctor.          aspirin EC 81 MG tablet Take by mouth.   Calcium Carbonate-Vitamin D 600-400 MG-UNIT tablet Take by mouth.   celecoxib 100 MG capsule Commonly known as: CELEBREX Take 100 mg by mouth 2 (two) times daily as needed.   Cranberry 500 MG Tabs Take by mouth. 2 daily   diclofenac Sodium 1 % Gel Commonly known as: VOLTAREN Apply topically.   estradiol 0.1 MG/GM vaginal cream Commonly known as: ESTRACE Estrogen Cream Instruction Discard applicator Apply pea sized amount  to tip of finger to urethra before bed. Wash hands well after application. Use Monday, Wednesday and Friday   famotidine 20 MG tablet Commonly known as: PEPCID Take by mouth.   Fish Oil 1000 MG Caps Take by mouth.   fluticasone 50 MCG/ACT nasal spray Commonly known as: FLONASE Place into the nose.   Metamucil 48.57 % Powd Generic drug: Psyllium Take by mouth.   omeprazole 20 MG capsule Commonly known as: PRILOSEC Take by mouth.   pravastatin 40 MG tablet Commonly known as: PRAVACHOL TAKE 1 TABLET EVERY DAY   PRESERVISION AREDS PO Take by mouth. Preservision 2 daily   propranolol 20 MG tablet Commonly known as: INDERAL Take 1 tablet by mouth 2 (two) times  daily.   sulfamethoxazole-trimethoprim 800-160 MG tablet Commonly known as: BACTRIM DS Take 1 tablet by mouth 2 (two) times daily.        Allergies:  Allergies  Allergen Reactions   Nitrofurantoin     Family History: Family History  Problem Relation Age of Onset   Diabetes Mellitus II Mother    Breast cancer Sister 42   Kidney disease Neg Hx    Bladder Cancer Neg Hx    Kidney cancer Neg Hx     Social History:  reports that she has never smoked. She has never used smokeless tobacco. She reports that she does not drink alcohol and does not use drugs.   Physical Exam: BP (!) 158/76   Pulse 75   Ht 5\' 2"  (1.575 m)   Wt 139 lb 6 oz (63.2 kg)   BMI 25.49 kg/m   Constitutional:  Alert and oriented, No acute distress. HEENT: [place] AT, moist mucus membranes.  Trachea midline, no masses. Neurologic: Grossly intact, no focal deficits, moving all 4 extremities. Psychiatric: Normal mood and affect.  Results for orders placed or performed in visit on 07/17/23  Microscopic Examination   Urine  Result Value Ref Range   WBC, UA 11-30 (A) 0 - 5 /hpf   RBC, Urine 3-10 (A) 0 - 2 /hpf   Epithelial Cells (non renal) 0-10 0 - 10 /hpf   Casts Present (A) None seen /lpf   Cast Type Hyaline casts N/A   Mucus, UA Present (A) Not Estab.   Bacteria, UA Moderate (A) None seen/Few  Urinalysis, Complete  Result Value Ref Range   Specific Gravity, UA 1.015 1.005 - 1.030   pH, UA 7.0 5.0 - 7.5   Color, UA Yellow Yellow   Appearance Ur Hazy (A) Clear   Leukocytes,UA 1+ (A) Negative   Protein,UA Negative Negative/Trace   Glucose, UA Negative Negative   Ketones, UA Negative Negative   RBC, UA 2+ (A) Negative   Bilirubin, UA Negative Negative   Urobilinogen, Ur 0.2 0.2 - 1.0 mg/dL   Nitrite, UA Negative Negative   Microscopic Examination See below:      Assessment & Plan:    1. Recurrent UTI - We had a lengthy discussion about treatment options.  - Urine culture sent today  -  Continue with topical estrogen cream - We discussed today whether or not it would be beneficial to proceed with suppression antibiotics. She has not had one since August. We will consider this for the future.  - She is aware that she can be seen here at the office for any acute UTI issues.   Recurrent UTI Prevention Strategies  Stay well hydrated. Get a moderate amount of exercise. Eat a diet rich in fruit and vegetables. Start  a bowel regimen to manage your constipation. Your goal is to have consistent, formed bowel movements that are easy for you to pass. You may use either of the over-the-counter supplements Benefiber or Miralax to help with this. I recommend that you try Benefiber first and move on to Miralax if this is not helping you enough. You may adjust the recommended dose of Miralax (one capful daily) to achieve this goal. Start taking an over-the-counter cranberry supplement for urinary tract health. Take this once or twice daily on an empty stomach, e.g. right before bed. Start taking an over-the-counter d-mannose supplement. Take this daily per packaging instructions. Start taking an over-the-counter probiotic containing the bacterial species called Lactobacillus. Take this daily. Start vaginal estrogen cream. Apply a pea-sized amount around the opening of the urethra every day for 2 weeks, then three times weekly forever.  2. Acute cystitis - Her urinalysis today is suspicious and she has been symptomatic. - We will send for culture and go ahead and treat her with a week of Bactrim and adjust as needed - Bactrim DS 800-160 BID for a week  3. History of hematuria - She does have some microscopic hematuria today but no signs of infection - We will continue to monitor this and potentially consider reevaluation next year if her microscopic hematuria persists.    Return in about 1 year (around 07/16/2024) for repeat UA with [facility].  I have reviewed the above documentation for  accuracy and completeness, and I agree with the above.   Kelly Scotland, MD   [facility] [address] [place], [place] [address] [phone]

## 2023-07-17 NOTE — Patient Instructions (Signed)
For UTI prevention take cranberry tablets, probiotic, D-Mannose daily.  

## 2023-07-18 DIAGNOSIS — M7582 Other shoulder lesions, left shoulder: Secondary | ICD-10-CM | POA: Diagnosis not present

## 2023-07-18 DIAGNOSIS — M25512 Pain in left shoulder: Secondary | ICD-10-CM | POA: Diagnosis not present

## 2023-07-18 DIAGNOSIS — M25552 Pain in left hip: Secondary | ICD-10-CM | POA: Diagnosis not present

## 2023-07-18 DIAGNOSIS — M1612 Unilateral primary osteoarthritis, left hip: Secondary | ICD-10-CM | POA: Diagnosis not present

## 2023-07-20 LAB — CULTURE, URINE COMPREHENSIVE

## 2024-01-01 DIAGNOSIS — H5203 Hypermetropia, bilateral: Secondary | ICD-10-CM | POA: Diagnosis not present

## 2024-01-01 DIAGNOSIS — Z961 Presence of intraocular lens: Secondary | ICD-10-CM | POA: Diagnosis not present

## 2024-01-01 DIAGNOSIS — H353131 Nonexudative age-related macular degeneration, bilateral, early dry stage: Secondary | ICD-10-CM | POA: Diagnosis not present

## 2024-01-01 DIAGNOSIS — H26493 Other secondary cataract, bilateral: Secondary | ICD-10-CM | POA: Diagnosis not present

## 2024-01-02 DIAGNOSIS — R030 Elevated blood-pressure reading, without diagnosis of hypertension: Secondary | ICD-10-CM | POA: Diagnosis not present

## 2024-01-02 DIAGNOSIS — R7303 Prediabetes: Secondary | ICD-10-CM | POA: Diagnosis not present

## 2024-01-02 DIAGNOSIS — Z0000 Encounter for general adult medical examination without abnormal findings: Secondary | ICD-10-CM | POA: Diagnosis not present

## 2024-01-09 DIAGNOSIS — I700 Atherosclerosis of aorta: Secondary | ICD-10-CM | POA: Diagnosis not present

## 2024-01-09 DIAGNOSIS — E7800 Pure hypercholesterolemia, unspecified: Secondary | ICD-10-CM | POA: Diagnosis not present

## 2024-01-09 DIAGNOSIS — R7303 Prediabetes: Secondary | ICD-10-CM | POA: Diagnosis not present

## 2024-01-09 DIAGNOSIS — I10 Essential (primary) hypertension: Secondary | ICD-10-CM | POA: Diagnosis not present

## 2024-03-16 DIAGNOSIS — D225 Melanocytic nevi of trunk: Secondary | ICD-10-CM | POA: Diagnosis not present

## 2024-03-16 DIAGNOSIS — D2272 Melanocytic nevi of left lower limb, including hip: Secondary | ICD-10-CM | POA: Diagnosis not present

## 2024-03-16 DIAGNOSIS — D2262 Melanocytic nevi of left upper limb, including shoulder: Secondary | ICD-10-CM | POA: Diagnosis not present

## 2024-03-16 DIAGNOSIS — D2261 Melanocytic nevi of right upper limb, including shoulder: Secondary | ICD-10-CM | POA: Diagnosis not present

## 2024-03-16 DIAGNOSIS — Z85828 Personal history of other malignant neoplasm of skin: Secondary | ICD-10-CM | POA: Diagnosis not present

## 2024-03-16 DIAGNOSIS — L821 Other seborrheic keratosis: Secondary | ICD-10-CM | POA: Diagnosis not present

## 2024-03-16 DIAGNOSIS — Z08 Encounter for follow-up examination after completed treatment for malignant neoplasm: Secondary | ICD-10-CM | POA: Diagnosis not present

## 2024-03-16 DIAGNOSIS — L570 Actinic keratosis: Secondary | ICD-10-CM | POA: Diagnosis not present

## 2024-03-16 DIAGNOSIS — D2271 Melanocytic nevi of right lower limb, including hip: Secondary | ICD-10-CM | POA: Diagnosis not present

## 2024-04-13 ENCOUNTER — Other Ambulatory Visit: Payer: Self-pay | Admitting: Internal Medicine

## 2024-04-13 DIAGNOSIS — Z1231 Encounter for screening mammogram for malignant neoplasm of breast: Secondary | ICD-10-CM

## 2024-04-23 DIAGNOSIS — R1032 Left lower quadrant pain: Secondary | ICD-10-CM | POA: Diagnosis not present

## 2024-05-06 DIAGNOSIS — R3915 Urgency of urination: Secondary | ICD-10-CM | POA: Diagnosis not present

## 2024-05-06 DIAGNOSIS — R1030 Lower abdominal pain, unspecified: Secondary | ICD-10-CM | POA: Diagnosis not present

## 2024-05-06 DIAGNOSIS — R8290 Unspecified abnormal findings in urine: Secondary | ICD-10-CM | POA: Diagnosis not present

## 2024-05-14 ENCOUNTER — Ambulatory Visit
Admission: RE | Admit: 2024-05-14 | Discharge: 2024-05-14 | Disposition: A | Discharge status: Home / Self Care | Source: Ambulatory Visit | Attending: Internal Medicine | Admitting: Internal Medicine

## 2024-05-14 DIAGNOSIS — Z1231 Encounter for screening mammogram for malignant neoplasm of breast: Secondary | ICD-10-CM | POA: Insufficient documentation

## 2024-06-12 DIAGNOSIS — N390 Urinary tract infection, site not specified: Secondary | ICD-10-CM | POA: Diagnosis not present

## 2024-06-12 DIAGNOSIS — J069 Acute upper respiratory infection, unspecified: Secondary | ICD-10-CM | POA: Diagnosis not present

## 2024-06-26 DIAGNOSIS — H6993 Unspecified Eustachian tube disorder, bilateral: Secondary | ICD-10-CM | POA: Diagnosis not present

## 2024-06-26 DIAGNOSIS — R0981 Nasal congestion: Secondary | ICD-10-CM | POA: Diagnosis not present

## 2024-06-26 DIAGNOSIS — H938X3 Other specified disorders of ear, bilateral: Secondary | ICD-10-CM | POA: Diagnosis not present

## 2024-07-07 DIAGNOSIS — R7303 Prediabetes: Secondary | ICD-10-CM | POA: Diagnosis not present

## 2024-07-07 DIAGNOSIS — I10 Essential (primary) hypertension: Secondary | ICD-10-CM | POA: Diagnosis not present

## 2024-07-07 DIAGNOSIS — I700 Atherosclerosis of aorta: Secondary | ICD-10-CM | POA: Diagnosis not present

## 2024-07-12 NOTE — Progress Notes (Unsigned)
 07/14/2024 4:56 PM   Kelly Rivas 1940-12-31 [id number]  Referring provider: Lenon Layman ORN, MD [address] [facility] [place],  [place] [address]  Urological history: 1.  High risk hematuria - Non-smoker - CTU 2018 -stable small renal cortical lesions -cysto 2018 NED - CTU (06/2022) left interpolar parenchymal calcification - cysto (06/2022) NED  2. Vaginal atrophy -s/p hysterectomy -negative mammogram 2023 -managed with vaginal estrogen cream applied three nights weekly    3. Nephrolithiasis -spontaneous passage of stone 2019    4. rUTI's -contributing factors of age, vaginal atrophy and constipation -documented urine cultures over the last year -June 12, 2024, greater than 2 organisms -May 06, 2024, greater than 2 organisms -April 23, 2024, mixed urogenital flora -July 17, 2023, mixed urogenital flora -vaginal estrogen cream, Vitamin C and cranberry tablet    5. OAB -contributing factors of age, vaginal atrophy, increase water intake and pelvic surgery   No chief complaint on file.  HPI: Kelly Rivas is a 83 y.o. woman who presents today for yearly visit.  Previous records reviewed.  They are having (1 to 7) or (8 or more) daytime voids,  they are having nocturia (1-2) or (3 or more) and urgency is (none, mild, strong, severe).   They are having (stress, urge or mixed incontinence.)    they are having urinary leakage (1-2 times weekly, 3 or more times weekly, 1-2 times daily and 3 or more times daily) They are using absorbent products for leakage (no, sometimes, always )   the type of products they use are (panty liners, absorbant pads, depends) *** daily.  They are not limiting fluids.  They are not engaging in toilet mapping  ***  UA ***  PVR ***  Serum creatinine 0.8, eGFR 74  Hemoglobin A1c 5.8  Fluid consumption ***   PMH: Past Medical History:  Diagnosis Date   Arthritis    Atrophic  vaginitis    Constipation    Dysuria    Flank pain    GERD (gastroesophageal reflux disease)    History of recurrent UTIs    HLD (hyperlipidemia)     Surgical History: Past Surgical History:  Procedure Laterality Date   ABDOMINAL HYSTERECTOMY     BREAST EXCISIONAL BIOPSY Bilateral 1970's   BREAST SURGERY     COLONOSCOPY N/A    COLONOSCOPY WITH PROPOFOL  N/A 03/24/2015   Procedure: COLONOSCOPY WITH PROPOFOL ;  Surgeon: Deward CINDERELLA Piedmont, MD;  Location: [facility] ENDOSCOPY;  Service: Gastroenterology;  Laterality: N/A;   MOHS SURGERY      Home Medications:  Allergies as of 07/14/2024       Reactions   Nitrofurantoin         Medication List        Accurate as of July 12, 2024  4:56 PM. If you have any questions, ask your nurse or doctor.          aspirin EC 81 MG tablet Take by mouth.   Calcium Carbonate-Vitamin D 600-400 MG-UNIT tablet Take by mouth.   celecoxib 100 MG capsule Commonly known as: CELEBREX Take 100 mg by mouth 2 (two) times daily as needed.   Cranberry 500 MG Tabs Take by mouth. 2 daily   diclofenac Sodium 1 % Gel Commonly known as: VOLTAREN Apply topically.   estradiol  0.1 MG/GM vaginal cream Commonly known as: ESTRACE  Estrogen Cream Instruction Discard applicator Apply pea sized amount to tip of finger to urethra before bed. Wash hands well  after application. Use Monday, Wednesday and Friday   famotidine 20 MG tablet Commonly known as: PEPCID Take by mouth.   Fish Oil 1000 MG Caps Take by mouth.   fluticasone 50 MCG/ACT nasal spray Commonly known as: FLONASE Place into the nose.   Metamucil 48.57 % Powd Generic drug: Psyllium Take by mouth.   omeprazole 20 MG capsule Commonly known as: PRILOSEC Take by mouth.   pravastatin 40 MG tablet Commonly known as: PRAVACHOL TAKE 1 TABLET EVERY DAY   PRESERVISION AREDS PO Take by mouth. Preservision 2 daily   propranolol 20 MG tablet Commonly known as: INDERAL Take 1 tablet by mouth 2  (two) times daily.   sulfamethoxazole -trimethoprim  800-160 MG tablet Commonly known as: BACTRIM  DS Take 1 tablet by mouth 2 (two) times daily.        Allergies:  Allergies  Allergen Reactions   Nitrofurantoin     Family History: Family History  Problem Relation Age of Onset   Diabetes Mellitus II Mother    Breast cancer Sister 93   Kidney disease Neg Hx    Bladder Cancer Neg Hx    Kidney cancer Neg Hx     Social History:  reports that she has never smoked. She has never used smokeless tobacco. She reports that she does not drink alcohol and does not use drugs.  ROS: Pertinent ROS in HPI  Physical Exam: There were no vitals taken for this visit.  Constitutional:  Well nourished. Alert and oriented, No acute distress. HEENT: [place] AT, moist mucus membranes.  Trachea midline, no masses. Cardiovascular: No clubbing, cyanosis, or edema. Respiratory: Normal respiratory effort, no increased work of breathing. GU: No CVA tenderness.  No bladder fullness or masses.  Recession of labia minora, dry, pale vulvar vaginal mucosa and loss of mucosal ridges and folds.  Normal urethral meatus, no lesions, no prolapse, no discharge.   No urethral masses, tenderness and/or tenderness. No bladder fullness, tenderness or masses. *** vagina mucosa, *** estrogen effect, no discharge, no lesions, *** pelvic support, *** cystocele and *** rectocele noted.  No cervical motion tenderness.  Uterus is freely mobile and non-fixed.  No adnexal/parametria masses or tenderness noted.  Anus and perineum are without rashes or lesions.   ***  Neurologic: Grossly intact, no focal deficits, moving all 4 extremities. Psychiatric: Normal mood and affect.    Laboratory Data: See Epic and HPI   I have reviewed the labs.   Pertinent Imaging: ***  Assessment & Plan:  ***  1.  High risk hematuria -Non-smoker -Work up x 2 - NED, most recent 2023 -no reports of gross heme -UA **  2. rUTI's - criteria for  recurrent UTI has been met with 2 or more infections in 6 months or 3 or greater infections in one year  - vaginal estrogen cream is first-line therapy for postmenopausal women  - patient is instructed to increase their water intake until the urine is pale yellow or clear (10 to 12 cups daily)  - taking probiotics that include  Lactobacillus crispatus in premenopausal women and oral capsules with Lactobacillus rhamnosus GR-1 and Lactobacillus reuteri RC-14 in postmenopausal women *** - Hiprex 1 gram twice daily if creatinine clearance is > 30 *** - could consider D-mannose or cranberry products, but evidence is weak with contradictory findings *** - address constipation issues ***  3. Genitourinary Syndrome of Menopause (GSM)  - Explained that GSM is a common condition that affects women during and after menopause. It is characterized  by a cluster of symptoms related to the genital and urinary tracts, including: vaginal dryness, pain or discomfort during intercourse (dyspareunia), burning or irritation in the vulva or vagina, thinning or loss of vaginal tissue, frequent urination, urgency (feeling the need to urinate immediately), incontinence (loss of bladder control), and pain or burning during urination - explained that GSM is primarily caused by the decline in estrogen levels during menopause. This leads to changes in the vaginal tissue, making it thinner, drier, and more susceptible to irritation. Other factors that may contribute to GSM include: Smoking, certain medications (e.g., chemotherapy drugs, and pelvic organ prolapse - advised that First-line treatment options are: Local low-dose vaginal estrogen (cream, ring, tablet), which improves dryness, irritation, dyspareunia and it is safe for most patients, including those with history of breast cancer (with multidisciplinary input). - advised they can also use vaginal moisturizers and lubricants (alone or combined) and avoid irritants and harsh  cleansers.   - Pelvic floor physical therapy for dysfunction is also helpful. *** - Referral to sex therapy or counseling if psychosocial concerns are present. *** - Reassured that there is no evidence linking local estrogen to breast or endometrial cancer - Explained that it may take up to 8 months of consistent application of the vaginal estrogen cream to cause unnecessary changes needed to address GSM and that this will be a lifelong medication - Will start with vaginal estrogen cream, applying a pea-sized amount with a fingertip just inside the vaginal introitus every night for 30 days and then continuing on to Monday, Wednesday and Friday nights - estradiol  (ESTRACE ) 0.01 % CREA vaginal cream, Apply one pea-sized amount around the opening of the urethra daily for 30 days, then 3 times weekly moving forward, sent to pharmacy ***  4. OAB - Behavioral: Initiate bladder retraining and timed voiding and pelvic floor physical therapy referral for Kegel exercises and biofeedback *** - Pharmacologic: Trial of mirabegron  25 mg daily for urgency component *** and vaginal estrogen cream (estradiol  0.01%) 2x/week for urogenital atrophy - Follow-up: Return in 8-12 weeks to assess response to therapy, consider urodynamic testing if symptoms persist or worsen *** - refer to discuss surgical options (e.g., midurethral sling) if stress incontinence remains bothersome despite conservative measures *** - Education: Discussed nature of mixed incontinence, treatment options, and expected outcomes and patient expressed understanding and interest in conservative management initially                                                 No follow-ups on file.  These notes generated with voice recognition software. I apologize for typographical errors.  CLOTILDA HELON RIGGERS  [facility] [address]  [address] [place], [place] [address] [phone]

## 2024-07-14 ENCOUNTER — Encounter: Payer: Self-pay | Admitting: Urology

## 2024-07-14 ENCOUNTER — Ambulatory Visit: Payer: Self-pay | Admitting: Urology

## 2024-07-14 VITALS — BP 183/98 | HR 64 | Ht 62.0 in | Wt 134.0 lb

## 2024-07-14 DIAGNOSIS — N3281 Overactive bladder: Secondary | ICD-10-CM | POA: Diagnosis not present

## 2024-07-14 DIAGNOSIS — E7800 Pure hypercholesterolemia, unspecified: Secondary | ICD-10-CM | POA: Diagnosis not present

## 2024-07-14 DIAGNOSIS — N958 Other specified menopausal and perimenopausal disorders: Secondary | ICD-10-CM

## 2024-07-14 DIAGNOSIS — Z0000 Encounter for general adult medical examination without abnormal findings: Secondary | ICD-10-CM | POA: Diagnosis not present

## 2024-07-14 DIAGNOSIS — R319 Hematuria, unspecified: Secondary | ICD-10-CM

## 2024-07-14 DIAGNOSIS — N390 Urinary tract infection, site not specified: Secondary | ICD-10-CM

## 2024-07-14 DIAGNOSIS — Z1331 Encounter for screening for depression: Secondary | ICD-10-CM | POA: Diagnosis not present

## 2024-07-14 DIAGNOSIS — I10 Essential (primary) hypertension: Secondary | ICD-10-CM | POA: Diagnosis not present

## 2024-07-14 DIAGNOSIS — Z23 Encounter for immunization: Secondary | ICD-10-CM | POA: Diagnosis not present

## 2024-07-14 DIAGNOSIS — R7303 Prediabetes: Secondary | ICD-10-CM | POA: Diagnosis not present

## 2024-07-14 LAB — MICROSCOPIC EXAMINATION: Epithelial Cells (non renal): >10 /HPF — AB (ref 0–10)

## 2024-07-14 LAB — URINALYSIS, COMPLETE
Bilirubin, UA: NEGATIVE
Glucose, UA: NEGATIVE
Ketones, UA: NEGATIVE
Nitrite, UA: NEGATIVE
Protein,UA: NEGATIVE
RBC, UA: NEGATIVE
Specific Gravity, UA: 1.015 (ref 1.005–1.030)
Urobilinogen, Ur: 0.2 mg/dL (ref 0.2–1.0)
pH, UA: 6 (ref 5.0–7.5)

## 2024-07-14 NOTE — Patient Instructions (Signed)
 Please contact Central Scheduling to set up your Renal Ultrasound appointment. [phone].

## 2024-07-21 ENCOUNTER — Ambulatory Visit
Admission: RE | Admit: 2024-07-21 | Discharge: 2024-07-21 | Disposition: A | Discharge status: Home / Self Care | Source: Ambulatory Visit | Attending: Urology | Admitting: Urology

## 2024-07-21 DIAGNOSIS — N390 Urinary tract infection, site not specified: Secondary | ICD-10-CM | POA: Insufficient documentation

## 2024-08-02 ENCOUNTER — Ambulatory Visit: Payer: Self-pay | Admitting: Urology

## 2024-08-03 NOTE — Telephone Encounter (Signed)
 Patient advised of information below. Patient verbalized understanding of results given.  Would you let Mrs. Kelly Rivas know that her renal ultrasound results were normal. There were no findings that would explain her recurrent urinary tract infections. This is reassuring, as it means there is no structural issue with your kidneys or urinary tract contributing to the infections. If If his symptoms continue or if she has another infection, please let us  know so we can discuss next steps in management.   Andrea Kirks LPN

## 2024-08-03 NOTE — Telephone Encounter (Signed)
-----   Message from [facility] sent at 08/02/2024  9:54 PM EST ----- Would you let Mrs. Hottenstein know that her renal ultrasound results were normal. There were no findings that would explain her recurrent urinary tract infections. This is reassuring, as it means there  is no structural issue with your kidneys or urinary tract contributing to the infections.  If If his symptoms continue or if she has another infection, please let us  know so we can discuss next steps  in management. ----- Message ----- From: Interface, Rad Results In Sent: 07/29/2024  11:57 PM EST To: Clotilda DELENA Cornwall, PA-C

## 2024-08-13 ENCOUNTER — Other Ambulatory Visit: Payer: Self-pay | Admitting: *Deleted

## 2024-08-13 MED ORDER — ESTRADIOL 0.01 % VA CREA: TOPICAL_CREAM | VAGINAL | 1 refills | Status: AC
